# Patient Record
Sex: Female | Born: 1940 | Race: White | Hispanic: No | Marital: Married | Smoking: Former smoker
Health system: Southern US, Community
[De-identification: ages and names within clinical notes are randomized; demographics above are authoritative.]

## PROBLEM LIST (undated history)

## (undated) DIAGNOSIS — I639 Cerebral infarction, unspecified: Secondary | ICD-10-CM

## (undated) DIAGNOSIS — K219 Gastro-esophageal reflux disease without esophagitis: Secondary | ICD-10-CM

## (undated) DIAGNOSIS — E7800 Pure hypercholesterolemia, unspecified: Secondary | ICD-10-CM

## (undated) DIAGNOSIS — I10 Essential (primary) hypertension: Secondary | ICD-10-CM

## (undated) DIAGNOSIS — I2510 Atherosclerotic heart disease of native coronary artery without angina pectoris: Secondary | ICD-10-CM

## (undated) DIAGNOSIS — R112 Nausea with vomiting, unspecified: Secondary | ICD-10-CM

## (undated) DIAGNOSIS — Z8719 Personal history of other diseases of the digestive system: Secondary | ICD-10-CM

## (undated) DIAGNOSIS — Z9889 Other specified postprocedural states: Secondary | ICD-10-CM

## (undated) DIAGNOSIS — E039 Hypothyroidism, unspecified: Secondary | ICD-10-CM

## (undated) DIAGNOSIS — M1990 Unspecified osteoarthritis, unspecified site: Secondary | ICD-10-CM

## (undated) HISTORY — PX: CATARACT EXTRACTION, BILATERAL: SHX1313

## (undated) HISTORY — PX: NASAL SEPTUM SURGERY: SHX37

## (undated) HISTORY — PX: BACK SURGERY: SHX140

## (undated) HISTORY — PX: DILATION AND CURETTAGE OF UTERUS: SHX78

## (undated) HISTORY — PX: JOINT REPLACEMENT: SHX530

---
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
## 1962-11-17 HISTORY — PX: APPENDECTOMY: SHX54

## 1962-11-17 HISTORY — PX: CHOLECYSTECTOMY OPEN: SUR202

## 1984-11-17 HISTORY — PX: VAGINAL HYSTERECTOMY: SUR661

## 2009-11-17 HISTORY — PX: LUMBAR DISC SURGERY: SHX700

## 2009-11-17 HISTORY — PX: BLADDER SUSPENSION: SHX72

## 2009-11-17 HISTORY — PX: RECTOCELE REPAIR: SHX761

## 2010-06-17 HISTORY — PX: CORONARY ANGIOPLASTY WITH STENT PLACEMENT: SHX49

## 2013-05-06 ENCOUNTER — Emergency Department (HOSPITAL_BASED_OUTPATIENT_CLINIC_OR_DEPARTMENT_OTHER): Payer: Medicare Other

## 2013-05-06 ENCOUNTER — Emergency Department (HOSPITAL_BASED_OUTPATIENT_CLINIC_OR_DEPARTMENT_OTHER)
Admission: EM | Admit: 2013-05-06 | Discharge: 2013-05-06 | Disposition: A | Discharge status: Home / Self Care | Payer: Medicare Other | Attending: Emergency Medicine | Admitting: Emergency Medicine

## 2013-05-06 ENCOUNTER — Encounter (HOSPITAL_BASED_OUTPATIENT_CLINIC_OR_DEPARTMENT_OTHER): Payer: Self-pay | Admitting: *Deleted

## 2013-05-06 DIAGNOSIS — M25519 Pain in unspecified shoulder: Secondary | ICD-10-CM | POA: Insufficient documentation

## 2013-05-06 DIAGNOSIS — M719 Bursopathy, unspecified: Secondary | ICD-10-CM | POA: Insufficient documentation

## 2013-05-06 DIAGNOSIS — E7800 Pure hypercholesterolemia, unspecified: Secondary | ICD-10-CM | POA: Insufficient documentation

## 2013-05-06 DIAGNOSIS — Z7982 Long term (current) use of aspirin: Secondary | ICD-10-CM | POA: Insufficient documentation

## 2013-05-06 DIAGNOSIS — M67919 Unspecified disorder of synovium and tendon, unspecified shoulder: Secondary | ICD-10-CM | POA: Insufficient documentation

## 2013-05-06 DIAGNOSIS — Z79899 Other long term (current) drug therapy: Secondary | ICD-10-CM | POA: Insufficient documentation

## 2013-05-06 DIAGNOSIS — I10 Essential (primary) hypertension: Secondary | ICD-10-CM | POA: Insufficient documentation

## 2013-05-06 HISTORY — DX: Atherosclerotic heart disease of native coronary artery without angina pectoris: I25.10

## 2013-05-06 HISTORY — DX: Essential (primary) hypertension: I10

## 2013-05-06 HISTORY — DX: Gastro-esophageal reflux disease without esophagitis: K21.9

## 2013-05-06 HISTORY — DX: Pure hypercholesterolemia, unspecified: E78.00

## 2013-05-06 MED ORDER — HYDROCODONE-ACETAMINOPHEN 5-325 MG PO TABS: 2.0000 | ORAL_TABLET | ORAL | Status: DC | PRN

## 2013-05-06 NOTE — ED Notes (Addendum)
Right arm pain x 1.5 weeks. Pain when she raises her arm or tries to lift. Has been taking Advil for a week with temporary relief. Hx of bursitis in her left arm.

## 2013-05-06 NOTE — ED Notes (Signed)
Pt refused arm sling, reported that she already had one at home.

## 2013-05-06 NOTE — ED Provider Notes (Signed)
History     CSN: [id number]  Arrival date & time 05/06/13  1523   None     Chief Complaint  Patient presents with  . Arm Pain    (Consider location/radiation/quality/duration/timing/severity/associated sxs/prior treatment) Patient is a 72 y.o. female presenting with shoulder pain. The history is provided by the patient. No language interpreter was used.  Shoulder Pain This is a new problem. Episode onset: 2 weks. The problem occurs constantly. The problem has been unchanged. Associated symptoms include arthralgias. Nothing aggravates the symptoms. She has tried nothing for the symptoms.  Pt complains of pain and decreased use of right shoulder for 2 weeks  Past Medical History  Diagnosis Date  . Hypertension   . High cholesterol     No past surgical history on file.  No family history on file.  History  Substance Use Topics  . Smoking status: Not on file  . Smokeless tobacco: Not on file  . Alcohol Use: Not on file    OB History   Grav Para Term Preterm Abortions TAB SAB Ect Mult Living                  Review of Systems  Musculoskeletal: Positive for arthralgias.  All other systems reviewed and are negative.    Allergies  Review of patient's allergies indicates not on file.  Home Medications   Current Outpatient Rx  Name  Route  Sig  Dispense  Refill  . aspirin 81 MG tablet   Oral   Take 81 mg by mouth daily.         Marland Kitchen losartan (COZAAR) 50 MG tablet   Oral   Take 50 mg by mouth daily.         . metoprolol tartrate (LOPRESSOR) 25 MG tablet   Oral   Take 25 mg by mouth 2 (two) times daily.         Marland Kitchen omeprazole (PRILOSEC) 20 MG capsule   Oral   Take 20 mg by mouth daily.         . rosuvastatin (CRESTOR) 5 MG tablet   Oral   Take 5 mg by mouth daily.           BP 142/51  Pulse 70  Temp(Src) 98 F (36.7 C) (Oral)  Resp 20  Ht 5\' 4"  (1.626 m)  Wt 170 lb (77.111 kg)  BMI 29.17 kg/m2  SpO2 94%  Physical Exam  Nursing note and  vitals reviewed. Constitutional: She is oriented to person, place, and time. She appears well-developed and well-nourished.  HENT:  Head: Normocephalic.  Eyes: Conjunctivae and EOM are normal. Pupils are equal, round, and reactive to light.  Neck: Normal range of motion.  Cardiovascular: Normal rate.   Musculoskeletal:  decreased range of motion right shoulder,  Limited abduction, tender ac joint and rotator cuff area  Neurological: She is alert and oriented to person, place, and time. She has normal reflexes.  Skin: Skin is warm.  Psychiatric: She has a normal mood and affect.    ED Course  Procedures (including critical care time)  Labs Reviewed - No data to display No results found.   1. Tendonitis of shoulder, right       MDM  Xray shows ac joint degenerative changes.   I suspect pt has tendonitis.   Pt advised to follow up with Dr. Lajoyce Corners.   Rx for hydrocodone        Elson Areas, [place] 05/06/13 2032

## 2013-05-07 NOTE — ED Provider Notes (Signed)
Medical screening examination/treatment/procedure(s) were performed by non-physician practitioner and as supervising physician I was immediately available for consultation/collaboration.   July Linam, MD 05/07/13 1503 

## 2015-07-25 ENCOUNTER — Other Ambulatory Visit: Payer: Self-pay | Admitting: Neurological Surgery

## 2015-07-25 DIAGNOSIS — M4316 Spondylolisthesis, lumbar region: Secondary | ICD-10-CM

## 2015-07-31 ENCOUNTER — Ambulatory Visit
Admission: RE | Admit: 2015-07-31 | Discharge: 2015-07-31 | Disposition: A | Discharge status: Home / Self Care | Payer: PPO | Source: Ambulatory Visit | Attending: Neurological Surgery | Admitting: Neurological Surgery

## 2015-07-31 DIAGNOSIS — M4316 Spondylolisthesis, lumbar region: Secondary | ICD-10-CM

## 2015-07-31 MED ORDER — GADOBENATE DIMEGLUMINE 529 MG/ML IV SOLN
15.0000 mL | Freq: Once | INTRAVENOUS | Status: AC | PRN
  Administered 2015-07-31: 15 mL via INTRAVENOUS

## 2015-08-08 ENCOUNTER — Other Ambulatory Visit: Payer: Self-pay

## 2015-08-08 ENCOUNTER — Other Ambulatory Visit: Payer: Self-pay | Admitting: Neurological Surgery

## 2015-08-08 DIAGNOSIS — M4316 Spondylolisthesis, lumbar region: Secondary | ICD-10-CM

## 2015-08-09 ENCOUNTER — Ambulatory Visit
Admission: RE | Admit: 2015-08-09 | Discharge: 2015-08-09 | Disposition: A | Discharge status: Home / Self Care | Payer: PPO | Source: Ambulatory Visit | Attending: Neurological Surgery | Admitting: Neurological Surgery

## 2015-08-09 DIAGNOSIS — M4316 Spondylolisthesis, lumbar region: Secondary | ICD-10-CM

## 2015-11-18 DIAGNOSIS — Z87891 Personal history of nicotine dependence: Secondary | ICD-10-CM | POA: Diagnosis not present

## 2015-11-18 DIAGNOSIS — M549 Dorsalgia, unspecified: Secondary | ICD-10-CM | POA: Diagnosis not present

## 2015-11-18 DIAGNOSIS — G8929 Other chronic pain: Secondary | ICD-10-CM | POA: Diagnosis not present

## 2015-11-18 DIAGNOSIS — I2510 Atherosclerotic heart disease of native coronary artery without angina pectoris: Secondary | ICD-10-CM | POA: Diagnosis not present

## 2015-11-18 DIAGNOSIS — J449 Chronic obstructive pulmonary disease, unspecified: Secondary | ICD-10-CM | POA: Diagnosis not present

## 2015-11-18 DIAGNOSIS — L509 Urticaria, unspecified: Secondary | ICD-10-CM | POA: Diagnosis not present

## 2015-11-27 DIAGNOSIS — N390 Urinary tract infection, site not specified: Secondary | ICD-10-CM | POA: Diagnosis not present

## 2015-11-27 DIAGNOSIS — N3941 Urge incontinence: Secondary | ICD-10-CM | POA: Diagnosis not present

## 2015-11-27 DIAGNOSIS — R319 Hematuria, unspecified: Secondary | ICD-10-CM | POA: Diagnosis not present

## 2015-11-27 DIAGNOSIS — N281 Cyst of kidney, acquired: Secondary | ICD-10-CM | POA: Diagnosis not present

## 2015-12-07 DIAGNOSIS — Z1231 Encounter for screening mammogram for malignant neoplasm of breast: Secondary | ICD-10-CM | POA: Diagnosis not present

## 2015-12-11 DIAGNOSIS — M7542 Impingement syndrome of left shoulder: Secondary | ICD-10-CM | POA: Diagnosis not present

## 2015-12-19 DIAGNOSIS — I10 Essential (primary) hypertension: Secondary | ICD-10-CM | POA: Diagnosis not present

## 2015-12-19 DIAGNOSIS — E785 Hyperlipidemia, unspecified: Secondary | ICD-10-CM | POA: Diagnosis not present

## 2015-12-19 DIAGNOSIS — E039 Hypothyroidism, unspecified: Secondary | ICD-10-CM | POA: Diagnosis not present

## 2015-12-19 DIAGNOSIS — I2510 Atherosclerotic heart disease of native coronary artery without angina pectoris: Secondary | ICD-10-CM | POA: Diagnosis not present

## 2015-12-24 DIAGNOSIS — H43813 Vitreous degeneration, bilateral: Secondary | ICD-10-CM | POA: Diagnosis not present

## 2015-12-24 DIAGNOSIS — H25813 Combined forms of age-related cataract, bilateral: Secondary | ICD-10-CM | POA: Diagnosis not present

## 2015-12-24 DIAGNOSIS — H35033 Hypertensive retinopathy, bilateral: Secondary | ICD-10-CM | POA: Diagnosis not present

## 2015-12-24 DIAGNOSIS — H524 Presbyopia: Secondary | ICD-10-CM | POA: Diagnosis not present

## 2015-12-24 DIAGNOSIS — H04123 Dry eye syndrome of bilateral lacrimal glands: Secondary | ICD-10-CM | POA: Diagnosis not present

## 2015-12-30 DIAGNOSIS — J302 Other seasonal allergic rhinitis: Secondary | ICD-10-CM | POA: Diagnosis not present

## 2015-12-30 DIAGNOSIS — J020 Streptococcal pharyngitis: Secondary | ICD-10-CM | POA: Diagnosis not present

## 2015-12-30 DIAGNOSIS — J101 Influenza due to other identified influenza virus with other respiratory manifestations: Secondary | ICD-10-CM | POA: Diagnosis not present

## 2016-01-31 DIAGNOSIS — E785 Hyperlipidemia, unspecified: Secondary | ICD-10-CM | POA: Diagnosis not present

## 2016-02-04 DIAGNOSIS — E7800 Pure hypercholesterolemia, unspecified: Secondary | ICD-10-CM | POA: Diagnosis not present

## 2016-02-04 DIAGNOSIS — I10 Essential (primary) hypertension: Secondary | ICD-10-CM | POA: Diagnosis not present

## 2016-02-20 DIAGNOSIS — N281 Cyst of kidney, acquired: Secondary | ICD-10-CM | POA: Diagnosis not present

## 2016-02-20 DIAGNOSIS — N390 Urinary tract infection, site not specified: Secondary | ICD-10-CM | POA: Diagnosis not present

## 2016-02-20 DIAGNOSIS — N3941 Urge incontinence: Secondary | ICD-10-CM | POA: Diagnosis not present

## 2016-02-26 DIAGNOSIS — H524 Presbyopia: Secondary | ICD-10-CM | POA: Diagnosis not present

## 2016-02-26 DIAGNOSIS — H25813 Combined forms of age-related cataract, bilateral: Secondary | ICD-10-CM | POA: Diagnosis not present

## 2016-02-26 DIAGNOSIS — H35033 Hypertensive retinopathy, bilateral: Secondary | ICD-10-CM | POA: Diagnosis not present

## 2016-02-26 DIAGNOSIS — H04123 Dry eye syndrome of bilateral lacrimal glands: Secondary | ICD-10-CM | POA: Diagnosis not present

## 2016-02-26 DIAGNOSIS — H43813 Vitreous degeneration, bilateral: Secondary | ICD-10-CM | POA: Diagnosis not present

## 2016-02-28 DIAGNOSIS — M25562 Pain in left knee: Secondary | ICD-10-CM | POA: Diagnosis not present

## 2016-02-28 DIAGNOSIS — N390 Urinary tract infection, site not specified: Secondary | ICD-10-CM | POA: Diagnosis not present

## 2016-02-28 DIAGNOSIS — S8002XA Contusion of left knee, initial encounter: Secondary | ICD-10-CM | POA: Diagnosis not present

## 2016-02-28 DIAGNOSIS — N281 Cyst of kidney, acquired: Secondary | ICD-10-CM | POA: Diagnosis not present

## 2016-02-28 DIAGNOSIS — N3941 Urge incontinence: Secondary | ICD-10-CM | POA: Diagnosis not present

## 2016-02-28 DIAGNOSIS — K5909 Other constipation: Secondary | ICD-10-CM | POA: Diagnosis not present

## 2016-02-28 DIAGNOSIS — R310 Gross hematuria: Secondary | ICD-10-CM | POA: Diagnosis not present

## 2016-02-28 DIAGNOSIS — M129 Arthropathy, unspecified: Secondary | ICD-10-CM | POA: Diagnosis not present

## 2016-02-28 DIAGNOSIS — B373 Candidiasis of vulva and vagina: Secondary | ICD-10-CM | POA: Diagnosis not present

## 2016-03-12 DIAGNOSIS — H2511 Age-related nuclear cataract, right eye: Secondary | ICD-10-CM | POA: Diagnosis not present

## 2016-03-12 DIAGNOSIS — H2512 Age-related nuclear cataract, left eye: Secondary | ICD-10-CM | POA: Diagnosis not present

## 2016-03-18 DIAGNOSIS — N3000 Acute cystitis without hematuria: Secondary | ICD-10-CM | POA: Diagnosis not present

## 2016-03-18 DIAGNOSIS — N3941 Urge incontinence: Secondary | ICD-10-CM | POA: Diagnosis not present

## 2016-03-18 DIAGNOSIS — N281 Cyst of kidney, acquired: Secondary | ICD-10-CM | POA: Diagnosis not present

## 2016-03-19 DIAGNOSIS — C44321 Squamous cell carcinoma of skin of nose: Secondary | ICD-10-CM | POA: Diagnosis not present

## 2016-03-19 DIAGNOSIS — L570 Actinic keratosis: Secondary | ICD-10-CM | POA: Diagnosis not present

## 2016-03-19 DIAGNOSIS — Z08 Encounter for follow-up examination after completed treatment for malignant neoplasm: Secondary | ICD-10-CM | POA: Diagnosis not present

## 2016-03-19 DIAGNOSIS — L820 Inflamed seborrheic keratosis: Secondary | ICD-10-CM | POA: Diagnosis not present

## 2016-03-19 DIAGNOSIS — Z85828 Personal history of other malignant neoplasm of skin: Secondary | ICD-10-CM | POA: Diagnosis not present

## 2016-03-19 DIAGNOSIS — D485 Neoplasm of uncertain behavior of skin: Secondary | ICD-10-CM | POA: Diagnosis not present

## 2016-03-31 DIAGNOSIS — H2511 Age-related nuclear cataract, right eye: Secondary | ICD-10-CM | POA: Diagnosis not present

## 2016-03-31 DIAGNOSIS — H25811 Combined forms of age-related cataract, right eye: Secondary | ICD-10-CM | POA: Diagnosis not present

## 2016-04-15 DIAGNOSIS — R310 Gross hematuria: Secondary | ICD-10-CM | POA: Diagnosis not present

## 2016-04-15 DIAGNOSIS — K5909 Other constipation: Secondary | ICD-10-CM | POA: Diagnosis not present

## 2016-04-15 DIAGNOSIS — N3941 Urge incontinence: Secondary | ICD-10-CM | POA: Diagnosis not present

## 2016-04-15 DIAGNOSIS — N3000 Acute cystitis without hematuria: Secondary | ICD-10-CM | POA: Diagnosis not present

## 2016-04-15 DIAGNOSIS — N281 Cyst of kidney, acquired: Secondary | ICD-10-CM | POA: Diagnosis not present

## 2016-04-17 DIAGNOSIS — C44321 Squamous cell carcinoma of skin of nose: Secondary | ICD-10-CM | POA: Diagnosis not present

## 2016-04-21 DIAGNOSIS — H2511 Age-related nuclear cataract, right eye: Secondary | ICD-10-CM | POA: Diagnosis not present

## 2016-04-21 DIAGNOSIS — H25812 Combined forms of age-related cataract, left eye: Secondary | ICD-10-CM | POA: Diagnosis not present

## 2016-04-21 DIAGNOSIS — H2512 Age-related nuclear cataract, left eye: Secondary | ICD-10-CM | POA: Diagnosis not present

## 2016-05-13 DIAGNOSIS — M1712 Unilateral primary osteoarthritis, left knee: Secondary | ICD-10-CM | POA: Diagnosis not present

## 2016-06-12 DIAGNOSIS — E7800 Pure hypercholesterolemia, unspecified: Secondary | ICD-10-CM | POA: Diagnosis not present

## 2016-06-16 DIAGNOSIS — I10 Essential (primary) hypertension: Secondary | ICD-10-CM | POA: Diagnosis not present

## 2016-06-16 DIAGNOSIS — L659 Nonscarring hair loss, unspecified: Secondary | ICD-10-CM | POA: Diagnosis not present

## 2016-06-16 DIAGNOSIS — E7800 Pure hypercholesterolemia, unspecified: Secondary | ICD-10-CM | POA: Diagnosis not present

## 2016-06-16 DIAGNOSIS — E039 Hypothyroidism, unspecified: Secondary | ICD-10-CM | POA: Diagnosis not present

## 2016-06-27 DIAGNOSIS — M7542 Impingement syndrome of left shoulder: Secondary | ICD-10-CM | POA: Diagnosis not present

## 2016-06-27 DIAGNOSIS — M75102 Unspecified rotator cuff tear or rupture of left shoulder, not specified as traumatic: Secondary | ICD-10-CM | POA: Diagnosis not present

## 2016-06-27 DIAGNOSIS — M1712 Unilateral primary osteoarthritis, left knee: Secondary | ICD-10-CM | POA: Diagnosis not present

## 2016-07-09 DIAGNOSIS — Z1273 Encounter for screening for malignant neoplasm of ovary: Secondary | ICD-10-CM | POA: Diagnosis not present

## 2016-07-09 DIAGNOSIS — N898 Other specified noninflammatory disorders of vagina: Secondary | ICD-10-CM | POA: Diagnosis not present

## 2016-07-09 DIAGNOSIS — Z01419 Encounter for gynecological examination (general) (routine) without abnormal findings: Secondary | ICD-10-CM | POA: Diagnosis not present

## 2016-07-09 DIAGNOSIS — R350 Frequency of micturition: Secondary | ICD-10-CM | POA: Diagnosis not present

## 2016-07-09 DIAGNOSIS — R1031 Right lower quadrant pain: Secondary | ICD-10-CM | POA: Diagnosis not present

## 2016-07-16 DIAGNOSIS — M7582 Other shoulder lesions, left shoulder: Secondary | ICD-10-CM | POA: Diagnosis not present

## 2016-07-16 DIAGNOSIS — M24012 Loose body in left shoulder: Secondary | ICD-10-CM | POA: Diagnosis not present

## 2016-07-16 DIAGNOSIS — M19012 Primary osteoarthritis, left shoulder: Secondary | ICD-10-CM | POA: Diagnosis not present

## 2016-07-16 DIAGNOSIS — M25412 Effusion, left shoulder: Secondary | ICD-10-CM | POA: Diagnosis not present

## 2016-07-22 DIAGNOSIS — M25812 Other specified joint disorders, left shoulder: Secondary | ICD-10-CM | POA: Diagnosis not present

## 2016-07-22 DIAGNOSIS — M7542 Impingement syndrome of left shoulder: Secondary | ICD-10-CM | POA: Diagnosis not present

## 2016-07-22 DIAGNOSIS — M19012 Primary osteoarthritis, left shoulder: Secondary | ICD-10-CM | POA: Diagnosis not present

## 2016-07-22 DIAGNOSIS — M1712 Unilateral primary osteoarthritis, left knee: Secondary | ICD-10-CM | POA: Diagnosis not present

## 2016-08-11 DIAGNOSIS — M47816 Spondylosis without myelopathy or radiculopathy, lumbar region: Secondary | ICD-10-CM | POA: Diagnosis not present

## 2016-08-11 DIAGNOSIS — M961 Postlaminectomy syndrome, not elsewhere classified: Secondary | ICD-10-CM | POA: Diagnosis not present

## 2016-08-11 DIAGNOSIS — I10 Essential (primary) hypertension: Secondary | ICD-10-CM | POA: Diagnosis not present

## 2016-08-17 DIAGNOSIS — I639 Cerebral infarction, unspecified: Secondary | ICD-10-CM

## 2016-08-17 HISTORY — DX: Cerebral infarction, unspecified: I63.9

## 2016-08-21 DIAGNOSIS — M1712 Unilateral primary osteoarthritis, left knee: Secondary | ICD-10-CM | POA: Diagnosis not present

## 2016-08-27 DIAGNOSIS — M47816 Spondylosis without myelopathy or radiculopathy, lumbar region: Secondary | ICD-10-CM | POA: Diagnosis not present

## 2016-08-28 DIAGNOSIS — M1712 Unilateral primary osteoarthritis, left knee: Secondary | ICD-10-CM | POA: Diagnosis not present

## 2016-09-05 DIAGNOSIS — M1712 Unilateral primary osteoarthritis, left knee: Secondary | ICD-10-CM | POA: Diagnosis not present

## 2016-09-07 DIAGNOSIS — I6529 Occlusion and stenosis of unspecified carotid artery: Secondary | ICD-10-CM | POA: Diagnosis not present

## 2016-09-07 DIAGNOSIS — I2722 Pulmonary hypertension due to left heart disease: Secondary | ICD-10-CM | POA: Diagnosis not present

## 2016-09-07 DIAGNOSIS — R4182 Altered mental status, unspecified: Secondary | ICD-10-CM | POA: Diagnosis not present

## 2016-09-07 DIAGNOSIS — I6523 Occlusion and stenosis of bilateral carotid arteries: Secondary | ICD-10-CM | POA: Diagnosis not present

## 2016-09-07 DIAGNOSIS — G8929 Other chronic pain: Secondary | ICD-10-CM | POA: Diagnosis not present

## 2016-09-07 DIAGNOSIS — R4781 Slurred speech: Secondary | ICD-10-CM | POA: Diagnosis not present

## 2016-09-07 DIAGNOSIS — Z7982 Long term (current) use of aspirin: Secondary | ICD-10-CM | POA: Diagnosis not present

## 2016-09-07 DIAGNOSIS — M1990 Unspecified osteoarthritis, unspecified site: Secondary | ICD-10-CM | POA: Diagnosis not present

## 2016-09-07 DIAGNOSIS — E039 Hypothyroidism, unspecified: Secondary | ICD-10-CM | POA: Diagnosis not present

## 2016-09-07 DIAGNOSIS — G451 Carotid artery syndrome (hemispheric): Secondary | ICD-10-CM | POA: Diagnosis not present

## 2016-09-07 DIAGNOSIS — E873 Alkalosis: Secondary | ICD-10-CM | POA: Diagnosis not present

## 2016-09-07 DIAGNOSIS — Z8673 Personal history of transient ischemic attack (TIA), and cerebral infarction without residual deficits: Secondary | ICD-10-CM | POA: Diagnosis not present

## 2016-09-07 DIAGNOSIS — E876 Hypokalemia: Secondary | ICD-10-CM | POA: Diagnosis not present

## 2016-09-07 DIAGNOSIS — E7800 Pure hypercholesterolemia, unspecified: Secondary | ICD-10-CM | POA: Diagnosis not present

## 2016-09-07 DIAGNOSIS — R410 Disorientation, unspecified: Secondary | ICD-10-CM | POA: Diagnosis not present

## 2016-09-07 DIAGNOSIS — Z23 Encounter for immunization: Secondary | ICD-10-CM | POA: Diagnosis not present

## 2016-09-07 DIAGNOSIS — M549 Dorsalgia, unspecified: Secondary | ICD-10-CM | POA: Diagnosis not present

## 2016-09-07 DIAGNOSIS — R531 Weakness: Secondary | ICD-10-CM | POA: Diagnosis not present

## 2016-09-07 DIAGNOSIS — J449 Chronic obstructive pulmonary disease, unspecified: Secondary | ICD-10-CM | POA: Diagnosis not present

## 2016-09-07 DIAGNOSIS — I6522 Occlusion and stenosis of left carotid artery: Secondary | ICD-10-CM | POA: Diagnosis not present

## 2016-09-07 DIAGNOSIS — I081 Rheumatic disorders of both mitral and tricuspid valves: Secondary | ICD-10-CM | POA: Diagnosis not present

## 2016-09-07 DIAGNOSIS — E785 Hyperlipidemia, unspecified: Secondary | ICD-10-CM | POA: Diagnosis not present

## 2016-09-07 DIAGNOSIS — Z880 Allergy status to penicillin: Secondary | ICD-10-CM | POA: Diagnosis not present

## 2016-09-07 DIAGNOSIS — Z885 Allergy status to narcotic agent status: Secondary | ICD-10-CM | POA: Diagnosis not present

## 2016-09-07 DIAGNOSIS — I10 Essential (primary) hypertension: Secondary | ICD-10-CM | POA: Diagnosis not present

## 2016-09-07 DIAGNOSIS — I2510 Atherosclerotic heart disease of native coronary artery without angina pectoris: Secondary | ICD-10-CM | POA: Diagnosis not present

## 2016-09-07 DIAGNOSIS — K219 Gastro-esophageal reflux disease without esophagitis: Secondary | ICD-10-CM | POA: Diagnosis not present

## 2016-09-07 DIAGNOSIS — R262 Difficulty in walking, not elsewhere classified: Secondary | ICD-10-CM | POA: Diagnosis not present

## 2016-09-08 DIAGNOSIS — G451 Carotid artery syndrome (hemispheric): Secondary | ICD-10-CM | POA: Diagnosis not present

## 2016-09-08 DIAGNOSIS — I361 Nonrheumatic tricuspid (valve) insufficiency: Secondary | ICD-10-CM | POA: Diagnosis not present

## 2016-09-08 DIAGNOSIS — I6522 Occlusion and stenosis of left carotid artery: Secondary | ICD-10-CM | POA: Diagnosis not present

## 2016-09-08 DIAGNOSIS — I2729 Other secondary pulmonary hypertension: Secondary | ICD-10-CM | POA: Diagnosis not present

## 2016-09-08 DIAGNOSIS — E876 Hypokalemia: Secondary | ICD-10-CM | POA: Diagnosis not present

## 2016-09-08 DIAGNOSIS — I10 Essential (primary) hypertension: Secondary | ICD-10-CM | POA: Diagnosis not present

## 2016-09-08 DIAGNOSIS — E039 Hypothyroidism, unspecified: Secondary | ICD-10-CM | POA: Diagnosis not present

## 2016-09-09 DIAGNOSIS — I340 Nonrheumatic mitral (valve) insufficiency: Secondary | ICD-10-CM | POA: Diagnosis not present

## 2016-09-09 DIAGNOSIS — I6522 Occlusion and stenosis of left carotid artery: Secondary | ICD-10-CM | POA: Diagnosis not present

## 2016-09-09 DIAGNOSIS — E876 Hypokalemia: Secondary | ICD-10-CM | POA: Diagnosis not present

## 2016-09-09 DIAGNOSIS — I2510 Atherosclerotic heart disease of native coronary artery without angina pectoris: Secondary | ICD-10-CM | POA: Diagnosis not present

## 2016-09-09 DIAGNOSIS — I10 Essential (primary) hypertension: Secondary | ICD-10-CM | POA: Diagnosis not present

## 2016-09-09 DIAGNOSIS — G451 Carotid artery syndrome (hemispheric): Secondary | ICD-10-CM | POA: Diagnosis not present

## 2016-09-17 DIAGNOSIS — G451 Carotid artery syndrome (hemispheric): Secondary | ICD-10-CM | POA: Diagnosis not present

## 2016-09-17 DIAGNOSIS — Z7982 Long term (current) use of aspirin: Secondary | ICD-10-CM | POA: Diagnosis not present

## 2016-09-22 DIAGNOSIS — I70203 Unspecified atherosclerosis of native arteries of extremities, bilateral legs: Secondary | ICD-10-CM | POA: Diagnosis not present

## 2016-09-22 DIAGNOSIS — Z08 Encounter for follow-up examination after completed treatment for malignant neoplasm: Secondary | ICD-10-CM | POA: Diagnosis not present

## 2016-09-22 DIAGNOSIS — Z85828 Personal history of other malignant neoplasm of skin: Secondary | ICD-10-CM | POA: Diagnosis not present

## 2016-09-22 DIAGNOSIS — L820 Inflamed seborrheic keratosis: Secondary | ICD-10-CM | POA: Diagnosis not present

## 2016-09-22 DIAGNOSIS — E7800 Pure hypercholesterolemia, unspecified: Secondary | ICD-10-CM | POA: Diagnosis not present

## 2016-09-22 DIAGNOSIS — Z87891 Personal history of nicotine dependence: Secondary | ICD-10-CM | POA: Diagnosis not present

## 2016-09-22 DIAGNOSIS — L570 Actinic keratosis: Secondary | ICD-10-CM | POA: Diagnosis not present

## 2016-09-22 DIAGNOSIS — I10 Essential (primary) hypertension: Secondary | ICD-10-CM | POA: Diagnosis not present

## 2016-09-22 DIAGNOSIS — I6523 Occlusion and stenosis of bilateral carotid arteries: Secondary | ICD-10-CM | POA: Diagnosis not present

## 2016-11-03 DIAGNOSIS — E039 Hypothyroidism, unspecified: Secondary | ICD-10-CM | POA: Diagnosis not present

## 2016-11-03 DIAGNOSIS — D510 Vitamin B12 deficiency anemia due to intrinsic factor deficiency: Secondary | ICD-10-CM | POA: Diagnosis not present

## 2016-11-03 DIAGNOSIS — J302 Other seasonal allergic rhinitis: Secondary | ICD-10-CM | POA: Diagnosis not present

## 2016-11-03 DIAGNOSIS — Z79899 Other long term (current) drug therapy: Secondary | ICD-10-CM | POA: Diagnosis not present

## 2016-11-03 DIAGNOSIS — E7800 Pure hypercholesterolemia, unspecified: Secondary | ICD-10-CM | POA: Diagnosis not present

## 2016-11-03 DIAGNOSIS — Z23 Encounter for immunization: Secondary | ICD-10-CM | POA: Diagnosis not present

## 2016-11-03 DIAGNOSIS — Z0000 Encounter for general adult medical examination without abnormal findings: Secondary | ICD-10-CM | POA: Diagnosis not present

## 2016-11-03 DIAGNOSIS — I2510 Atherosclerotic heart disease of native coronary artery without angina pectoris: Secondary | ICD-10-CM | POA: Diagnosis not present

## 2016-11-03 DIAGNOSIS — R911 Solitary pulmonary nodule: Secondary | ICD-10-CM | POA: Diagnosis not present

## 2016-11-03 DIAGNOSIS — I10 Essential (primary) hypertension: Secondary | ICD-10-CM | POA: Diagnosis not present

## 2016-11-03 DIAGNOSIS — M810 Age-related osteoporosis without current pathological fracture: Secondary | ICD-10-CM | POA: Diagnosis not present

## 2016-11-18 DIAGNOSIS — R911 Solitary pulmonary nodule: Secondary | ICD-10-CM | POA: Diagnosis not present

## 2016-12-03 ENCOUNTER — Ambulatory Visit (INDEPENDENT_AMBULATORY_CARE_PROVIDER_SITE_OTHER): Payer: Self-pay | Admitting: Orthopaedic Surgery

## 2016-12-08 DIAGNOSIS — Z1231 Encounter for screening mammogram for malignant neoplasm of breast: Secondary | ICD-10-CM | POA: Diagnosis not present

## 2016-12-11 ENCOUNTER — Ambulatory Visit (INDEPENDENT_AMBULATORY_CARE_PROVIDER_SITE_OTHER): Payer: PPO | Admitting: Physician Assistant

## 2016-12-11 ENCOUNTER — Encounter (INDEPENDENT_AMBULATORY_CARE_PROVIDER_SITE_OTHER): Payer: Self-pay | Admitting: Physician Assistant

## 2016-12-11 ENCOUNTER — Ambulatory Visit (INDEPENDENT_AMBULATORY_CARE_PROVIDER_SITE_OTHER): Payer: PPO

## 2016-12-11 DIAGNOSIS — M25562 Pain in left knee: Secondary | ICD-10-CM

## 2016-12-11 DIAGNOSIS — M25512 Pain in left shoulder: Secondary | ICD-10-CM

## 2016-12-11 DIAGNOSIS — M19012 Primary osteoarthritis, left shoulder: Secondary | ICD-10-CM

## 2016-12-11 NOTE — Progress Notes (Signed)
Office Visit Note   Patient: Sandra Abbott           Date of Birth: 09/12/1941           MRN: [id number] Visit Date: 12/11/2016              Requested by: No referring provider defined for this encounter. PCP: Derrill Center, MD   Assessment & Plan: Visit Diagnoses:  1. Acute pain of left shoulder   2. Acute pain of left knee   3. Primary osteoarthritis of left shoulder     Plan: We discussed the patient her findings on x-rays and clinical exam today due to the fact that she's had multiple injections in her left knee despite this continues to have pain. This time would recommend MRI of the left knee to rule out medial meniscal tear also evaluate the cartilage. Follow-up 2 weeks after the MRI to go over the results and discuss further treatment. In regards to the shoulder she's had appropriate treatment for her left shoulder glenohumeral arthritis Dr. Ninfa Linden and myself feel that the next step would be to refer her to a surgeon for left shoulder replacement. She did like to think about this and will let us know if she wants a referral. Did encourage her to try to keep the shoulder moving as much as possible doing pendulum,Codman and wall crawls.  Follow-Up Instructions: Return in about 2 weeks (around 12/25/2016) for After MRI.   Orders:  Orders Placed This Encounter  Procedures  . XR Knee 1-2 Views Left  . XR Shoulder Left  . MR Knee Left w/o contrast   No orders of the defined types were placed in this encounter.     Procedures: No procedures performed   Clinical Data: No additional findings.   Subjective: Chief Complaint  Patient presents with  . Left Knee - Pain  . Left Shoulder - Pain    HPI Sandra Abbott is a 76 year old female who comes in today with chief complaint of left knee and left shoulder pain. She denies any injury to the left knee. She notes that the left knee hurts with walking or bending she states she has decreased range of motion of the knee. And  that she has swelling of the knee. She reports multiple cortisone injections in the left knee without any significant relief of her pain. Also reports Synvisc  supplemental injections left knee with no relief. Her left knee pain has been ongoing for the past year  Left shoulder pain and decreased range of motion. He reports that on MRI showed she had arthritis of the shoulder and was told that she needed a shoulder replacement. She had one intra-articular injection of the left shoulder and gave her good relief for about 2 weeks and then pain returned. He denies any radicular symptoms down the left arm or neck pain. Review of Systems See HPI  Objective: Vital Signs: There were no vitals taken for this visit.  Physical Exam  Constitutional: She is oriented to person, place, and time. She appears well-developed and well-nourished. No distress.  Eyes: EOM are normal.  Cardiovascular: Intact distal pulses.   Pulmonary/Chest: Effort normal.  Neurological: She is alert and oriented to person, place, and time.  Psychiatric: She has a normal mood and affect.    Ortho Exam Bilateral shoulders 5 out of 5 strengths against resistance with internal and external rotation. Right shoulder full forward flexion actively. Left shoulder forward flexion actively to 110 passively I  can bring her to 130 she has significant pain with this maneuver. Empty can test negative bilaterally. Impingement testing positive left shoulder. Radial pulses are 2+ bilaterally and equal. Patient intact bilateral hands to light touch throughout. Bilateral knees she has full extension right knee full flexion left knee flexion to approximately 110. No instability with valgus varus stressing of either knee. Positive McMurray's left knee. Global tenderness left knee. No abnormal warmth erythema or ecchymosis of either knee. Left knee with mild effusion. Able to do a straight leg raise bilaterally. Specialty Comments:  No specialty  comments available.  Imaging: Xr Knee 1-2 Views Left  Result Date: 12/11/2016 Left knee 2 views: Mild tricompartmental changes of the left knee. No acute fractures no other bony abnormalities  Xr Shoulder Left  Result Date: 12/11/2016 3 views left shoulder: No acute fracture. Humerus is well located. General changes acromioclavicular joint. End-stage arthritic changes of the glenohumeral joint.    PMFS History: There are no active problems to display for this patient.  Past Medical History:  Diagnosis Date  . Coronary artery disease   . GERD (gastroesophageal reflux disease)   . High cholesterol   . High cholesterol   . Hypertension     No family history on file.  Past Surgical History:  Procedure Laterality Date  . ABDOMINAL HYSTERECTOMY    . BACK SURGERY    . BLADDER SURGERY    . CHOLECYSTECTOMY    . CORONARY ANGIOPLASTY WITH STENT PLACEMENT     Social History   Occupational History  . Not on file.   Social History Main Topics  . Smoking status: Former Smoker    Quit date: 05/06/1988  . Smokeless tobacco: Never Used  . Alcohol use No  . Drug use: No  . Sexual activity: Not on file

## 2016-12-18 DIAGNOSIS — H524 Presbyopia: Secondary | ICD-10-CM | POA: Diagnosis not present

## 2016-12-18 DIAGNOSIS — H04123 Dry eye syndrome of bilateral lacrimal glands: Secondary | ICD-10-CM | POA: Diagnosis not present

## 2016-12-18 DIAGNOSIS — H25813 Combined forms of age-related cataract, bilateral: Secondary | ICD-10-CM | POA: Diagnosis not present

## 2016-12-18 DIAGNOSIS — H43813 Vitreous degeneration, bilateral: Secondary | ICD-10-CM | POA: Diagnosis not present

## 2016-12-18 DIAGNOSIS — H35033 Hypertensive retinopathy, bilateral: Secondary | ICD-10-CM | POA: Diagnosis not present

## 2016-12-23 ENCOUNTER — Inpatient Hospital Stay: Admission: RE | Admit: 2016-12-23 | Payer: PPO | Source: Ambulatory Visit

## 2016-12-28 ENCOUNTER — Other Ambulatory Visit: Payer: PPO

## 2016-12-29 ENCOUNTER — Ambulatory Visit (INDEPENDENT_AMBULATORY_CARE_PROVIDER_SITE_OTHER): Payer: PPO | Admitting: Orthopaedic Surgery

## 2016-12-30 ENCOUNTER — Ambulatory Visit
Admission: RE | Admit: 2016-12-30 | Discharge: 2016-12-30 | Disposition: A | Discharge status: Home / Self Care | Payer: PPO | Source: Ambulatory Visit | Attending: Physician Assistant | Admitting: Physician Assistant

## 2016-12-30 DIAGNOSIS — M25562 Pain in left knee: Secondary | ICD-10-CM

## 2017-01-05 ENCOUNTER — Ambulatory Visit (INDEPENDENT_AMBULATORY_CARE_PROVIDER_SITE_OTHER): Payer: PPO | Admitting: Orthopaedic Surgery

## 2017-01-05 DIAGNOSIS — G8929 Other chronic pain: Secondary | ICD-10-CM

## 2017-01-05 DIAGNOSIS — M1712 Unilateral primary osteoarthritis, left knee: Secondary | ICD-10-CM | POA: Insufficient documentation

## 2017-01-05 DIAGNOSIS — M25562 Pain in left knee: Secondary | ICD-10-CM | POA: Diagnosis not present

## 2017-01-05 NOTE — Progress Notes (Signed)
Mrs. Omar is following up after MRI of her left knee. We decided obtain this MRI due to the severe pain she is having in her left knee with locking catching. Although she 76 years old her joint spaces on plain films still looked like there was decent space and her knee. She has tried multiple steroid injection in this knee as well as hyaluronic acid injections. She's worked on anti-inflammatories, activity modification, and quad training exercises. Her pain is daily and this point is 10 out of 10. It is detrimentally affected her activities daily living, her quality of life, and her mobility. She is here for review of MRI of her knee today. She said her pain is getting worse.  On examination of her left knee she has global tenderness. Hurts on both medial and lateral joint line. His patellofemoral crepitation as well. The knee feels ligamentously stable and there is painful range motion of the knee and the range of motion is full. MRI is reviewed with her and her husband and shows areas of high-grade partial to full-thickness cartilage loss all throughout the knee. It's most prevalent on the medial femoral condyle medial tibial plateau. There is degenerative tearing of the medial and lateral meniscus as well as thickening of the MCL. There significant particular osteophytes and patellofemoral disease.  Given the severity of findings on the MRI we are recommending knee replacement surgery. Also given the fact she is failed all forms conservative treatment. I showed her a knee model and explained in detail what knee replacement surgery involves. We had a thorough discussion of the risks and benefits of this surgery as well as a detailed discussion of her intraoperative and postoperative course. She does wish to have this surgery performed in the near future with her goals are decreased pain, improve mobility, and improved quality of life. We will work on getting the surgery set up. We would then see her back at  2 weeks postoperative for wound assessment and mobility assessment but no x-rays of be needed.

## 2017-01-06 ENCOUNTER — Other Ambulatory Visit (INDEPENDENT_AMBULATORY_CARE_PROVIDER_SITE_OTHER): Payer: Self-pay | Admitting: Physician Assistant

## 2017-01-07 DIAGNOSIS — I2510 Atherosclerotic heart disease of native coronary artery without angina pectoris: Secondary | ICD-10-CM | POA: Diagnosis not present

## 2017-01-07 DIAGNOSIS — I10 Essential (primary) hypertension: Secondary | ICD-10-CM | POA: Diagnosis not present

## 2017-01-07 DIAGNOSIS — E785 Hyperlipidemia, unspecified: Secondary | ICD-10-CM | POA: Diagnosis not present

## 2017-01-07 DIAGNOSIS — I6529 Occlusion and stenosis of unspecified carotid artery: Secondary | ICD-10-CM | POA: Diagnosis not present

## 2017-01-19 DIAGNOSIS — I2510 Atherosclerotic heart disease of native coronary artery without angina pectoris: Secondary | ICD-10-CM | POA: Diagnosis not present

## 2017-01-19 DIAGNOSIS — E785 Hyperlipidemia, unspecified: Secondary | ICD-10-CM | POA: Diagnosis not present

## 2017-02-03 ENCOUNTER — Encounter (HOSPITAL_COMMUNITY)
Admission: RE | Admit: 2017-02-03 | Discharge: 2017-02-03 | Disposition: A | Discharge status: Home / Self Care | Payer: PPO | Source: Ambulatory Visit | Attending: Orthopaedic Surgery | Admitting: Orthopaedic Surgery

## 2017-02-03 ENCOUNTER — Other Ambulatory Visit (INDEPENDENT_AMBULATORY_CARE_PROVIDER_SITE_OTHER): Payer: Self-pay | Admitting: Orthopaedic Surgery

## 2017-02-03 ENCOUNTER — Encounter (HOSPITAL_COMMUNITY): Payer: Self-pay

## 2017-02-03 DIAGNOSIS — Z01812 Encounter for preprocedural laboratory examination: Secondary | ICD-10-CM | POA: Diagnosis not present

## 2017-02-03 DIAGNOSIS — M1712 Unilateral primary osteoarthritis, left knee: Secondary | ICD-10-CM

## 2017-02-03 HISTORY — DX: Hypothyroidism, unspecified: E03.9

## 2017-02-03 HISTORY — DX: Other specified postprocedural states: Z98.890

## 2017-02-03 HISTORY — DX: Unspecified osteoarthritis, unspecified site: M19.90

## 2017-02-03 HISTORY — DX: Personal history of other diseases of the digestive system: Z87.19

## 2017-02-03 HISTORY — DX: Cerebral infarction, unspecified: I63.9

## 2017-02-03 HISTORY — DX: Nausea with vomiting, unspecified: R11.2

## 2017-02-03 LAB — BASIC METABOLIC PANEL
Anion gap: 11 (ref 5–15)
BUN: 12 mg/dL (ref 6–20)
CHLORIDE: 104 mmol/L (ref 101–111)
CO2: 24 mmol/L (ref 22–32)
CREATININE: 0.75 mg/dL (ref 0.44–1.00)
Calcium: 9.1 mg/dL (ref 8.9–10.3)
GFR calc Af Amer: >60 mL/min (ref >60)
GFR calc non Af Amer: >60 mL/min (ref >60)
Glucose, Bld: 117 mg/dL — ABNORMAL HIGH (ref 65–99)
Potassium: 3.7 mmol/L (ref 3.5–5.1)
SODIUM: 139 mmol/L (ref 135–145)

## 2017-02-03 LAB — CBC
HCT: 38 % (ref 36.0–46.0)
Hemoglobin: 12.6 g/dL (ref 12.0–15.0)
MCH: 30.4 pg (ref 26.0–34.0)
MCHC: 33.2 g/dL (ref 30.0–36.0)
MCV: 91.8 fL (ref 78.0–100.0)
Platelets: 225 10*3/uL (ref 150–400)
RBC: 4.14 MIL/uL (ref 3.87–5.11)
RDW: 12.9 % (ref 11.5–15.5)
WBC: 6 10*3/uL (ref 4.0–10.5)

## 2017-02-03 LAB — SURGICAL PCR SCREEN
MRSA, PCR: NEGATIVE
STAPHYLOCOCCUS AUREUS: NEGATIVE

## 2017-02-03 MED ORDER — CLINDAMYCIN PHOSPHATE 900 MG/50ML IV SOLN
900.0000 mg | Freq: Once | INTRAVENOUS | Status: AC
  Administered 2017-02-10: 900 mg via INTRAVENOUS

## 2017-02-03 NOTE — Progress Notes (Addendum)
PCP: Dr. Jenny Reichmann weaver in [facility] Cardiologist: Dr. Blanchie Serve in [facility]  Pt. Stopped plavix 02/02/17. Pt. Unsure  if Dr. Ninfa Linden wanted her to stop aspirin. Office closed for lunch. Instructed pt. To call his office and get clarification . Pt. Verbalizes understanding.  Left message for sherrie billings  @ Dr. Trevor Mace office of pt. Allergy to PCN.

## 2017-02-03 NOTE — Pre-Procedure Instructions (Addendum)
Sandra Abbott  02/03/2017      [place], [place] - [address] [place] [address] Phone: [phone] Fax: [phone]    Your procedure is scheduled on Tues. March 27  Report to [facility] Admitting at 11:00 A.M.  Call this number if you have problems the morning of surgery:  [phone]   Remember:  Do not eat food or drink liquids after midnight.  Take these medicines the morning of surgery with A SIP OF WATER : tylenol if needed, zyrtec, flonase, levothyroxine(synthroid), metoprolol (lopressor), omeprazole(prilosec)             Stop aspirin, advil, motrin, ibuprofen, aleve, BC Powders, Goody's, vitamins/herbal medicines: fish oil   Do not wear jewelry, make-up or nail polish.  Do not wear lotions, powders, or perfumes, or deoderant.  Do not shave 48 hours prior to surgery.  Men may shave face and neck.  Do not bring valuables to the hospital.  [facility] is not responsible for any belongings or valuables.  Contacts, dentures or bridgework may not be worn into surgery.  Leave your suitcase in the car.  After surgery it may be brought to your room.  For patients admitted to the hospital, discharge time will be determined by your treatment team.  Patients discharged the day of surgery will not be allowed to drive home.    Special instructions:  [facility]- Preparing For Surgery  Before surgery, you can play an important role. Because skin is not sterile, your skin needs to be as free of germs as possible. You can reduce the number of germs on your skin by washing with CHG (chlorahexidine gluconate) Soap before surgery.  CHG is an antiseptic cleaner which kills germs and bonds with the skin to continue killing germs even after washing.  Please do not use if you have an allergy to CHG or antibacterial soaps. If your skin becomes reddened/irritated stop using the CHG.  Do not shave (including legs and underarms)  for at least 48 hours prior to first CHG shower. It is OK to shave your face.  Please follow these instructions carefully.   1. Shower the NIGHT BEFORE SURGERY and the MORNING OF SURGERY with CHG.   2. If you chose to wash your hair, wash your hair first as usual with your normal shampoo.  3. After you shampoo, rinse your hair and body thoroughly to remove the shampoo.  4. Use CHG as you would any other liquid soap. You can apply CHG directly to the skin and wash gently with a scrungie or a clean washcloth.   5. Apply the CHG Soap to your body ONLY FROM THE NECK DOWN.  Do not use on open wounds or open sores. Avoid contact with your eyes, ears, mouth and genitals (private parts). Wash genitals (private parts) with your normal soap.  6. Wash thoroughly, paying special attention to the area where your surgery will be performed.  7. Thoroughly rinse your body with warm water from the neck down.  8. DO NOT shower/wash with your normal soap after using and rinsing off the CHG Soap.  9. Pat yourself dry with a CLEAN TOWEL.   10. Wear CLEAN PAJAMAS   11. Place CLEAN SHEETS on your bed the night of your first shower and DO NOT SLEEP WITH PETS.    Day of Surgery: Do not apply any deodorants/lotions. Please wear clean clothes to the hospital/surgery center.  Please read over the following fact sheets that you were given. Coughing and Deep Breathing, Total Joint Packet and MRSA Information

## 2017-02-05 NOTE — Progress Notes (Addendum)
Anesthesia Chart Review: Patient is a 76 year old female scheduled for left TKA on 02/10/17 by Dr. Jean Rosenthal.  History includes former smoker, CAD s/p DES mid RCA 07/08/10, hypertension, hypercholesterolemia, GERD, hiatal hernia, TIA 09/07/16, hypothyroidism, post-operative N/V, hysterectomy, cholecystectomy, L4-S2 decompression with lateral fusion 08/2011.    PCP is Dr. Gwenyth Allegra [facility]; see Care Everywhere). He cleared her from a medical standpoint.  Cardiologist is Dr. Blanchie Serve ([facility], see Care Everywhere), last visit 01/07/17. He recommended stress test prior to undergoing knee surgery. This was done 01/19/17 and was non-ischemic.  Meds include Plavix (last dose 02/02/17), aspirin 81 mg, Lipitor, Zyrtec, study, Flonase, levothyroxine, losartan, magnesium oxide, Lopressor, fish oil, Prilosec.    BP (!) 128/58   Pulse 62   Temp 36.7 C   Resp 20   Ht 5\' 4"  (1.626 m)   Wt 177 lb 14.4 oz (80.7 kg)   SpO2 97%   BMI 30.54 kg/m   EKG 09/07/16 [facility]): Result Narrative: Normal sinus rhythm Possible Left atrial enlargement Nonspecific ST and T wave abnormality Abnormal ECG When compared with ECG of 10-Oct-2015 01:39, No significant change was found Confirmed by Arline Asp [phone]) on 09/07/2016 3:21:43 PM  Nuclear stress test 01/19/17 ([place]; Care Everywhere): Summary  Well tolerated Lexiscan perfusion stress test, with no clinical ischemia  during vasodilator stress.  There is normal isotope uptake following Lexiscan injection. There is no  evidence of ischemia.  There is reverse redistribution noted in the apex of unclear significance  Normal LV function with EF of 77 %.  Echocardiogram 0/24/17 [facility]; Care Everywhere): Summary Structurally normal mitral valve. Mild mitral regurgitation. Ejection fraction is visually estimated at 65-70% Normal left ventricular size and systolic function with no appreciable segmental abnormality.  LHC 06/26/11  ([facility]):  Left main normal. Diffuse irregularities LAD. 30% proximal LAD. Left circumflex with 40% mid stenosis. Diffuse irregularities RCA. Previous stent is clear. Proximal RCA 35%. Distal RCA (mid subsection) 40%. Summary: RCA patent stent.  All vessels are calcified ++. No obstructive lesions seen. Normal LV function. Recommendations: Medical therapy because no interventional therapy is required.   Carotid U/S 09/22/16 [facility]; [facility]): Report not viewable in Care Everywhere, but according to 09/22/16 note, "Her carotid duplex scan shows a 1-39% stenosis of the bilateral internal carotid arteries. Plan: We will repeat her carotid duplex scan again in 1 year."  CTA Head/Neck 09/07/16 ([place]; Care Everywhere): Result Impression: CTA NECK - Plaque right carotid bifurcation with less than 50% diameter stenosis proximal right internal carotid artery. - Left carotid bifurcation calcification with 62% diameter stenosis proximal left internal carotid artery. - Codominant vertebral arteries. Mild narrowing proximal left vertebral artery. CTA HEAD - Cavernous segment internal carotid artery calcified plaque with mild to slightly moderate narrowing. Otherwise anterior circulation without medium or large size vessel significant stenosis or occlusion. - Fetal origin of the right posterior cerebral artery. - Ectatic vertebral arteries and basilar artery with mild narrowing distal right vertebral artery. - Minimally bulbous appearance of the basilar tip which is felt to be related to the fetal origin of the right posterior cerebral artery rather than aneurysm. - Mild branch vessel irregularity.  MRI Brain 09/08/16 [facility]; Care Everywhere): Result Impression: Unremarkable exam. No acute infarction.  CT chest 11/18/16 [facility]; Care Everywhere): IMPRESSION: 1. Anterior right lung base nodule of concern on prior study represents an area of right middle lobe  scarring. 2. New 4 mm posterior left lower lobe  pulmonary nodule. No follow-up needed if patient is low-risk. Non-contrast chest CT can be considered in 12 months if patient is high-risk. This recommendation follows the consensus statement: Guidelines for Management of Incidental Pulmonary Nodules Detected on CT Images: From the Fleischner Society 2017; Radiology 2017; 284:228-243. 3. Coronary artery and thoracoabdominal aortic atherosclerosis. 4. Mild intra and extrahepatic biliary distention may be related to prior cholecystectomy. Correlation with liver function test may prove helpful.  Preoperative labs noted. Cr 0.75. Glucose 117. CBC WNL. (Normal LFTs 09/08/16.)  She has recent PCP and cardiology evaluations with medical clearance and non-ischemic stress test. TIA is > 5 months ago (no infarct by MRI). If no acute changes then I anticipate that she can proceed as planned.  George Hugh [facility] Stay [facility]/Anesthesiology Phone [phone] 02/05/2017 4:33 PM

## 2017-02-10 ENCOUNTER — Ambulatory Visit (HOSPITAL_COMMUNITY): Payer: PPO | Admitting: Certified Registered Nurse Anesthetist

## 2017-02-10 ENCOUNTER — Inpatient Hospital Stay (HOSPITAL_COMMUNITY)
Admission: AD | Admit: 2017-02-10 | Discharge: 2017-02-14 | DRG: 470 | Disposition: A | Discharge status: Home Health | Payer: PPO | Source: Ambulatory Visit | Attending: Orthopaedic Surgery | Admitting: Orthopaedic Surgery

## 2017-02-10 ENCOUNTER — Inpatient Hospital Stay (HOSPITAL_COMMUNITY): Payer: PPO

## 2017-02-10 ENCOUNTER — Encounter (HOSPITAL_COMMUNITY): Payer: Self-pay | Admitting: Certified Registered Nurse Anesthetist

## 2017-02-10 ENCOUNTER — Ambulatory Visit (HOSPITAL_COMMUNITY): Payer: PPO | Admitting: Vascular Surgery

## 2017-02-10 ENCOUNTER — Encounter (HOSPITAL_COMMUNITY)
Admission: AD | Disposition: A | Discharge status: Home Health | Payer: Self-pay | Source: Ambulatory Visit | Attending: Orthopaedic Surgery

## 2017-02-10 DIAGNOSIS — Z90710 Acquired absence of both cervix and uterus: Secondary | ICD-10-CM

## 2017-02-10 DIAGNOSIS — Z96652 Presence of left artificial knee joint: Secondary | ICD-10-CM

## 2017-02-10 DIAGNOSIS — E039 Hypothyroidism, unspecified: Secondary | ICD-10-CM | POA: Diagnosis present

## 2017-02-10 DIAGNOSIS — Z955 Presence of coronary angioplasty implant and graft: Secondary | ICD-10-CM

## 2017-02-10 DIAGNOSIS — I2510 Atherosclerotic heart disease of native coronary artery without angina pectoris: Secondary | ICD-10-CM | POA: Diagnosis not present

## 2017-02-10 DIAGNOSIS — E7800 Pure hypercholesterolemia, unspecified: Secondary | ICD-10-CM | POA: Diagnosis present

## 2017-02-10 DIAGNOSIS — Z6830 Body mass index (BMI) 30.0-30.9, adult: Secondary | ICD-10-CM

## 2017-02-10 DIAGNOSIS — G8918 Other acute postprocedural pain: Secondary | ICD-10-CM | POA: Diagnosis not present

## 2017-02-10 DIAGNOSIS — Z8673 Personal history of transient ischemic attack (TIA), and cerebral infarction without residual deficits: Secondary | ICD-10-CM | POA: Diagnosis not present

## 2017-02-10 DIAGNOSIS — D62 Acute posthemorrhagic anemia: Secondary | ICD-10-CM | POA: Diagnosis not present

## 2017-02-10 DIAGNOSIS — Z87891 Personal history of nicotine dependence: Secondary | ICD-10-CM | POA: Diagnosis not present

## 2017-02-10 DIAGNOSIS — E669 Obesity, unspecified: Secondary | ICD-10-CM | POA: Diagnosis not present

## 2017-02-10 DIAGNOSIS — M1712 Unilateral primary osteoarthritis, left knee: Secondary | ICD-10-CM | POA: Diagnosis not present

## 2017-02-10 DIAGNOSIS — G8929 Other chronic pain: Secondary | ICD-10-CM | POA: Diagnosis not present

## 2017-02-10 DIAGNOSIS — Z7951 Long term (current) use of inhaled steroids: Secondary | ICD-10-CM

## 2017-02-10 DIAGNOSIS — M549 Dorsalgia, unspecified: Secondary | ICD-10-CM | POA: Diagnosis not present

## 2017-02-10 DIAGNOSIS — Z79899 Other long term (current) drug therapy: Secondary | ICD-10-CM

## 2017-02-10 DIAGNOSIS — Z7902 Long term (current) use of antithrombotics/antiplatelets: Secondary | ICD-10-CM

## 2017-02-10 DIAGNOSIS — I10 Essential (primary) hypertension: Secondary | ICD-10-CM | POA: Diagnosis not present

## 2017-02-10 DIAGNOSIS — K219 Gastro-esophageal reflux disease without esophagitis: Secondary | ICD-10-CM | POA: Diagnosis not present

## 2017-02-10 DIAGNOSIS — Z7982 Long term (current) use of aspirin: Secondary | ICD-10-CM

## 2017-02-10 DIAGNOSIS — Z471 Aftercare following joint replacement surgery: Secondary | ICD-10-CM | POA: Diagnosis not present

## 2017-02-10 DIAGNOSIS — M25562 Pain in left knee: Secondary | ICD-10-CM | POA: Diagnosis not present

## 2017-02-10 HISTORY — PX: TOTAL KNEE ARTHROPLASTY: SHX125

## 2017-02-10 SURGERY — ARTHROPLASTY, KNEE, TOTAL
Anesthesia: Spinal | Site: Knee | Laterality: Left

## 2017-02-10 MED ORDER — CLINDAMYCIN PHOSPHATE 600 MG/50ML IV SOLN
600.0000 mg | Freq: Four times a day (QID) | INTRAVENOUS | Status: AC
  Administered 2017-02-10 – 2017-02-11 (×2): 600 mg via INTRAVENOUS
  Filled 2017-02-10 (×2): qty 50

## 2017-02-10 MED ORDER — ACETAMINOPHEN 650 MG RE SUPP: 650.0000 mg | Freq: Four times a day (QID) | RECTAL | Status: DC | PRN

## 2017-02-10 MED ORDER — FENTANYL CITRATE (PF) 100 MCG/2ML IJ SOLN
INTRAMUSCULAR | Status: AC
  Administered 2017-02-10: 50 ug
  Filled 2017-02-10: qty 2

## 2017-02-10 MED ORDER — 0.9 % SODIUM CHLORIDE (POUR BTL) OPTIME
TOPICAL | Status: DC | PRN
  Administered 2017-02-10: 1000 mL

## 2017-02-10 MED ORDER — POLYETHYLENE GLYCOL 3350 17 G PO PACK
17.0000 g | PACK | Freq: Every day | ORAL | Status: DC | PRN
  Administered 2017-02-13: 17 g via ORAL
  Filled 2017-02-10: qty 1

## 2017-02-10 MED ORDER — MEPERIDINE HCL 25 MG/ML IJ SOLN: 6.2500 mg | INTRAMUSCULAR | Status: DC | PRN

## 2017-02-10 MED ORDER — FENTANYL CITRATE (PF) 100 MCG/2ML IJ SOLN
INTRAMUSCULAR | Status: AC
  Filled 2017-02-10: qty 2

## 2017-02-10 MED ORDER — LIDOCAINE 2% (20 MG/ML) 5 ML SYRINGE
INTRAMUSCULAR | Status: AC
  Filled 2017-02-10: qty 15

## 2017-02-10 MED ORDER — SODIUM CHLORIDE 0.9 % IR SOLN
Status: DC | PRN
  Administered 2017-02-10: 3000 mL

## 2017-02-10 MED ORDER — PROPOFOL 10 MG/ML IV BOLUS
INTRAVENOUS | Status: AC
  Filled 2017-02-10: qty 20

## 2017-02-10 MED ORDER — MIDAZOLAM HCL 2 MG/2ML IJ SOLN
INTRAMUSCULAR | Status: AC
  Administered 2017-02-10: 1 mg
  Filled 2017-02-10: qty 2

## 2017-02-10 MED ORDER — ATORVASTATIN CALCIUM 10 MG PO TABS
10.0000 mg | ORAL_TABLET | Freq: Every day | ORAL | Status: DC
  Administered 2017-02-10 – 2017-02-13 (×4): 10 mg via ORAL
  Filled 2017-02-10 (×4): qty 1

## 2017-02-10 MED ORDER — LIDOCAINE HCL (CARDIAC) 20 MG/ML IV SOLN
INTRAVENOUS | Status: DC | PRN
  Administered 2017-02-10: 40 mg via INTRATRACHEAL

## 2017-02-10 MED ORDER — CLOPIDOGREL BISULFATE 75 MG PO TABS
75.0000 mg | ORAL_TABLET | Freq: Every day | ORAL | Status: DC
  Administered 2017-02-11 – 2017-02-14 (×4): 75 mg via ORAL
  Filled 2017-02-10 (×4): qty 1

## 2017-02-10 MED ORDER — DOCUSATE SODIUM 100 MG PO CAPS
100.0000 mg | ORAL_CAPSULE | Freq: Two times a day (BID) | ORAL | Status: DC
  Administered 2017-02-10 – 2017-02-13 (×6): 100 mg via ORAL
  Filled 2017-02-10 (×7): qty 1

## 2017-02-10 MED ORDER — MENTHOL 3 MG MT LOZG: 1.0000 | LOZENGE | OROMUCOSAL | Status: DC | PRN

## 2017-02-10 MED ORDER — BUPIVACAINE IN DEXTROSE 0.75-8.25 % IT SOLN
INTRATHECAL | Status: DC | PRN
  Administered 2017-02-10: 1.75 mL via INTRATHECAL

## 2017-02-10 MED ORDER — METHOCARBAMOL 1000 MG/10ML IJ SOLN: 500.0000 mg | Freq: Four times a day (QID) | INTRAVENOUS | Status: DC | PRN

## 2017-02-10 MED ORDER — CHLORHEXIDINE GLUCONATE 4 % EX LIQD: 60.0000 mL | Freq: Once | CUTANEOUS | Status: DC

## 2017-02-10 MED ORDER — VITAMIN B-12 1000 MCG PO TABS
1000.0000 ug | ORAL_TABLET | Freq: Every day | ORAL | Status: DC
  Administered 2017-02-11 – 2017-02-14 (×4): 1000 ug via ORAL
  Filled 2017-02-10 (×4): qty 1

## 2017-02-10 MED ORDER — HYDROMORPHONE HCL 2 MG/ML IJ SOLN
1.0000 mg | INTRAMUSCULAR | Status: DC | PRN
  Administered 2017-02-10 – 2017-02-11 (×4): 1 mg via INTRAVENOUS
  Filled 2017-02-10 (×4): qty 1

## 2017-02-10 MED ORDER — LORATADINE 10 MG PO TABS
10.0000 mg | ORAL_TABLET | Freq: Every day | ORAL | Status: DC
  Administered 2017-02-11 – 2017-02-14 (×4): 10 mg via ORAL
  Filled 2017-02-10 (×4): qty 1

## 2017-02-10 MED ORDER — METHOCARBAMOL 500 MG PO TABS
500.0000 mg | ORAL_TABLET | Freq: Four times a day (QID) | ORAL | Status: DC | PRN
  Administered 2017-02-10 – 2017-02-12 (×5): 500 mg via ORAL
  Filled 2017-02-10 (×4): qty 1

## 2017-02-10 MED ORDER — ACETAMINOPHEN 325 MG PO TABS: 650.0000 mg | ORAL_TABLET | Freq: Four times a day (QID) | ORAL | Status: DC | PRN

## 2017-02-10 MED ORDER — ONDANSETRON HCL 4 MG/2ML IJ SOLN
INTRAMUSCULAR | Status: AC
  Filled 2017-02-10: qty 2

## 2017-02-10 MED ORDER — FENTANYL CITRATE (PF) 100 MCG/2ML IJ SOLN
25.0000 ug | INTRAMUSCULAR | Status: DC | PRN
  Administered 2017-02-10 (×3): 50 ug via INTRAVENOUS

## 2017-02-10 MED ORDER — ONDANSETRON HCL 4 MG PO TABS
4.0000 mg | ORAL_TABLET | Freq: Four times a day (QID) | ORAL | Status: DC | PRN
  Filled 2017-02-10: qty 1

## 2017-02-10 MED ORDER — METOCLOPRAMIDE HCL 5 MG PO TABS: 5.0000 mg | ORAL_TABLET | Freq: Three times a day (TID) | ORAL | Status: DC | PRN

## 2017-02-10 MED ORDER — EZETIMIBE 10 MG PO TABS
10.0000 mg | ORAL_TABLET | Freq: Every day | ORAL | Status: DC
  Administered 2017-02-10 – 2017-02-13 (×4): 10 mg via ORAL
  Filled 2017-02-10 (×4): qty 1

## 2017-02-10 MED ORDER — METOPROLOL TARTRATE 12.5 MG HALF TABLET: 25.0000 mg | ORAL_TABLET | Freq: Once | ORAL | Status: DC

## 2017-02-10 MED ORDER — PANTOPRAZOLE SODIUM 40 MG PO TBEC
40.0000 mg | DELAYED_RELEASE_TABLET | Freq: Every day | ORAL | Status: DC
Start: 2017-02-11 — End: 2017-02-14
  Administered 2017-02-11 – 2017-02-14 (×4): 40 mg via ORAL
  Filled 2017-02-10 (×4): qty 1

## 2017-02-10 MED ORDER — PHENOL 1.4 % MT LIQD: 1.0000 | OROMUCOSAL | Status: DC | PRN

## 2017-02-10 MED ORDER — PROPOFOL 10 MG/ML IV BOLUS
INTRAVENOUS | Status: DC | PRN
  Administered 2017-02-10 (×2): 15 mg via INTRAVENOUS

## 2017-02-10 MED ORDER — PHENYLEPHRINE HCL 10 MG/ML IJ SOLN
INTRAMUSCULAR | Status: DC | PRN
  Administered 2017-02-10 (×2): 120 ug via INTRAVENOUS
  Administered 2017-02-10: 80 ug via INTRAVENOUS

## 2017-02-10 MED ORDER — COENZYME Q10 300 MG PO CAPS: 300.0000 mg | ORAL_CAPSULE | Freq: Every day | ORAL | Status: DC

## 2017-02-10 MED ORDER — SODIUM CHLORIDE 0.9 % IV SOLN
INTRAVENOUS | Status: DC
  Administered 2017-02-10: 17:00:00 via INTRAVENOUS
  Administered 2017-02-12: 75 mL/h via INTRAVENOUS

## 2017-02-10 MED ORDER — PHENYLEPHRINE 40 MCG/ML (10ML) SYRINGE FOR IV PUSH (FOR BLOOD PRESSURE SUPPORT)
PREFILLED_SYRINGE | INTRAVENOUS | Status: AC
  Filled 2017-02-10: qty 10

## 2017-02-10 MED ORDER — ALUM & MAG HYDROXIDE-SIMETH 200-200-20 MG/5ML PO SUSP: 30.0000 mL | ORAL | Status: DC | PRN

## 2017-02-10 MED ORDER — LEVOTHYROXINE SODIUM 75 MCG PO TABS
75.0000 ug | ORAL_TABLET | Freq: Every day | ORAL | Status: DC
  Administered 2017-02-11 – 2017-02-14 (×4): 75 ug via ORAL
  Filled 2017-02-10 (×4): qty 1

## 2017-02-10 MED ORDER — CLINDAMYCIN PHOSPHATE 900 MG/50ML IV SOLN
INTRAVENOUS | Status: AC
  Filled 2017-02-10: qty 50

## 2017-02-10 MED ORDER — CALCIUM CITRATE-VITAMIN D 500-400 MG-UNIT PO CHEW
1.0000 | CHEWABLE_TABLET | Freq: Every day | ORAL | Status: DC
  Filled 2017-02-10: qty 1

## 2017-02-10 MED ORDER — PROPOFOL 500 MG/50ML IV EMUL
INTRAVENOUS | Status: DC | PRN
  Administered 2017-02-10: 100 ug/kg/min via INTRAVENOUS

## 2017-02-10 MED ORDER — OXYCODONE HCL 5 MG PO TABS
5.0000 mg | ORAL_TABLET | ORAL | Status: DC | PRN
  Administered 2017-02-10 – 2017-02-12 (×7): 10 mg via ORAL
  Filled 2017-02-10 (×6): qty 2

## 2017-02-10 MED ORDER — MAGNESIUM OXIDE 250 MG PO TABS: 250.0000 mg | ORAL_TABLET | Freq: Every day | ORAL | Status: DC

## 2017-02-10 MED ORDER — METOPROLOL TARTRATE 25 MG PO TABS
25.0000 mg | ORAL_TABLET | Freq: Two times a day (BID) | ORAL | Status: DC
  Administered 2017-02-10 – 2017-02-14 (×7): 25 mg via ORAL
  Filled 2017-02-10 (×8): qty 1

## 2017-02-10 MED ORDER — LOSARTAN POTASSIUM 50 MG PO TABS
100.0000 mg | ORAL_TABLET | Freq: Every day | ORAL | Status: DC
  Administered 2017-02-12 – 2017-02-14 (×3): 100 mg via ORAL
  Filled 2017-02-10 (×4): qty 2

## 2017-02-10 MED ORDER — FLUTICASONE PROPIONATE 50 MCG/ACT NA SUSP
1.0000 | Freq: Every day | NASAL | Status: DC
  Administered 2017-02-11 – 2017-02-14 (×4): 1 via NASAL
  Filled 2017-02-10: qty 16

## 2017-02-10 MED ORDER — PROMETHAZINE HCL 25 MG/ML IJ SOLN: 6.2500 mg | INTRAMUSCULAR | Status: DC | PRN

## 2017-02-10 MED ORDER — LACTATED RINGERS IV SOLN
INTRAVENOUS | Status: DC
  Administered 2017-02-10 (×2): via INTRAVENOUS

## 2017-02-10 MED ORDER — ROPIVACAINE HCL 7.5 MG/ML IJ SOLN
INTRAMUSCULAR | Status: DC | PRN
  Administered 2017-02-10: 20 mL via PERINEURAL

## 2017-02-10 MED ORDER — ASPIRIN EC 81 MG PO TBEC
81.0000 mg | DELAYED_RELEASE_TABLET | Freq: Every day | ORAL | Status: DC
  Administered 2017-02-10 – 2017-02-13 (×4): 81 mg via ORAL
  Filled 2017-02-10 (×4): qty 1

## 2017-02-10 MED ORDER — KETOROLAC TROMETHAMINE 15 MG/ML IJ SOLN
7.5000 mg | Freq: Four times a day (QID) | INTRAMUSCULAR | Status: DC
  Administered 2017-02-10 – 2017-02-11 (×3): 7.5 mg via INTRAVENOUS
  Filled 2017-02-10 (×3): qty 1

## 2017-02-10 MED ORDER — OXYCODONE HCL 5 MG PO TABS
ORAL_TABLET | ORAL | Status: AC
  Filled 2017-02-10: qty 2

## 2017-02-10 MED ORDER — DIPHENHYDRAMINE HCL 12.5 MG/5ML PO ELIX: 12.5000 mg | ORAL_SOLUTION | ORAL | Status: DC | PRN

## 2017-02-10 MED ORDER — LACTATED RINGERS IV SOLN: INTRAVENOUS | Status: DC

## 2017-02-10 MED ORDER — CALCIUM CARBONATE-VITAMIN D 500-200 MG-UNIT PO TABS
1.0000 | ORAL_TABLET | Freq: Every day | ORAL | Status: DC
  Administered 2017-02-11 – 2017-02-14 (×4): 1 via ORAL
  Filled 2017-02-10 (×5): qty 1

## 2017-02-10 MED ORDER — METOPROLOL TARTRATE 12.5 MG HALF TABLET
ORAL_TABLET | ORAL | Status: AC
  Filled 2017-02-10: qty 2

## 2017-02-10 MED ORDER — ONDANSETRON HCL 4 MG/2ML IJ SOLN
4.0000 mg | Freq: Four times a day (QID) | INTRAMUSCULAR | Status: DC | PRN
Start: 2017-02-10 — End: 2017-02-14
  Administered 2017-02-10 – 2017-02-11 (×2): 4 mg via INTRAVENOUS
  Filled 2017-02-10: qty 2

## 2017-02-10 MED ORDER — METOCLOPRAMIDE HCL 5 MG/ML IJ SOLN
5.0000 mg | Freq: Three times a day (TID) | INTRAMUSCULAR | Status: DC | PRN
  Administered 2017-02-12: 10 mg via INTRAVENOUS
  Filled 2017-02-10: qty 2

## 2017-02-10 MED ORDER — GLYCOPYRROLATE 0.2 MG/ML IJ SOLN
INTRAMUSCULAR | Status: DC | PRN
Start: 2017-02-10 — End: 2017-02-10
  Administered 2017-02-10: .2 mg via INTRAVENOUS

## 2017-02-10 MED ORDER — METHOCARBAMOL 500 MG PO TABS
ORAL_TABLET | ORAL | Status: AC
  Filled 2017-02-10: qty 1

## 2017-02-10 MED ORDER — ROCURONIUM BROMIDE 50 MG/5ML IV SOSY
PREFILLED_SYRINGE | INTRAVENOUS | Status: AC
  Filled 2017-02-10: qty 5

## 2017-02-10 MED ORDER — PHENYLEPHRINE HCL 10 MG/ML IJ SOLN
INTRAVENOUS | Status: DC | PRN
  Administered 2017-02-10: 40 ug/min via INTRAVENOUS

## 2017-02-10 SURGICAL SUPPLY — 67 items
BANDAGE ACE 6X5 VEL STRL LF (GAUZE/BANDAGES/DRESSINGS) ×3 IMPLANT
BANDAGE ESMARK 6X9 LF (GAUZE/BANDAGES/DRESSINGS) ×1 IMPLANT
BEARIN INSERT TIBIAL 11 (Orthopedic Implant) IMPLANT
BEARIN INSERT TIBIAL 11MM (Orthopedic Implant) IMPLANT
BEARIN INSERT TIBIAL 3X13 (Insert) ×2 IMPLANT
BEARIN INSERT TIBIAL 3X13MM (Insert) ×1 IMPLANT
BEARING INSERT TIBIAL 11 (Orthopedic Implant) IMPLANT
BEARING INSERT TIBIAL 3X13 (Insert) ×1 IMPLANT
BLADE SAG 18X100X1.27 (BLADE) ×3 IMPLANT
BNDG ESMARK 6X9 LF (GAUZE/BANDAGES/DRESSINGS) ×3
BOWL SMART MIX CTS (DISPOSABLE) ×3 IMPLANT
CAPT KNEE TOTAL 3 ×3 IMPLANT
CEMENT BONE SIMPLEX SPEEDSET (Cement) ×6 IMPLANT
CLOSURE WOUND 1/2 X4 (GAUZE/BANDAGES/DRESSINGS) ×1
COVER SURGICAL LIGHT HANDLE (MISCELLANEOUS) ×3 IMPLANT
CUFF TOURNIQUET SINGLE 34IN LL (TOURNIQUET CUFF) ×3 IMPLANT
CUFF TOURNIQUET SINGLE 44IN (TOURNIQUET CUFF) IMPLANT
DRAPE EXTREMITY T 121X128X90 (DRAPE) ×3 IMPLANT
DRAPE PROXIMA HALF (DRAPES) ×3 IMPLANT
DRAPE U-SHAPE 47X51 STRL (DRAPES) ×3 IMPLANT
DRSG PAD ABDOMINAL 8X10 ST (GAUZE/BANDAGES/DRESSINGS) ×3 IMPLANT
DURAPREP 26ML APPLICATOR (WOUND CARE) ×3 IMPLANT
ELECT CAUTERY BLADE 6.4 (BLADE) ×3 IMPLANT
ELECT REM PT RETURN 9FT ADLT (ELECTROSURGICAL) ×3
ELECTRODE REM PT RTRN 9FT ADLT (ELECTROSURGICAL) ×1 IMPLANT
FACESHIELD WRAPAROUND (MASK) ×9 IMPLANT
GAUZE SPONGE 4X4 12PLY STRL (GAUZE/BANDAGES/DRESSINGS) ×3 IMPLANT
GAUZE XEROFORM 1X8 LF (GAUZE/BANDAGES/DRESSINGS) ×3 IMPLANT
GLOVE BIOGEL PI IND STRL 8 (GLOVE) ×2 IMPLANT
GLOVE BIOGEL PI INDICATOR 8 (GLOVE) ×4
GLOVE ORTHO TXT STRL SZ7.5 (GLOVE) ×3 IMPLANT
GLOVE SURG ORTHO 8.0 STRL STRW (GLOVE) ×3 IMPLANT
GLOVE SURG SS PI 6.5 STRL IVOR (GLOVE) ×6 IMPLANT
GOWN STRL REUS W/ TWL LRG LVL3 (GOWN DISPOSABLE) IMPLANT
GOWN STRL REUS W/ TWL XL LVL3 (GOWN DISPOSABLE) ×2 IMPLANT
GOWN STRL REUS W/TWL LRG LVL3 (GOWN DISPOSABLE)
GOWN STRL REUS W/TWL XL LVL3 (GOWN DISPOSABLE) ×4
HANDPIECE INTERPULSE COAX TIP (DISPOSABLE) ×2
IMMOBILIZER KNEE 22 UNIV (SOFTGOODS) ×3 IMPLANT
KIT BASIN OR (CUSTOM PROCEDURE TRAY) ×3 IMPLANT
KIT ROOM TURNOVER OR (KITS) ×3 IMPLANT
MANIFOLD NEPTUNE II (INSTRUMENTS) ×3 IMPLANT
NDL SAFETY ECLIPSE 18X1.5 (NEEDLE) IMPLANT
NEEDLE HYPO 18GX1.5 SHARP (NEEDLE)
NS IRRIG 1000ML POUR BTL (IV SOLUTION) ×3 IMPLANT
PACK TOTAL JOINT (CUSTOM PROCEDURE TRAY) ×3 IMPLANT
PAD ABD 8X10 STRL (GAUZE/BANDAGES/DRESSINGS) ×3 IMPLANT
PAD ARMBOARD 7.5X6 YLW CONV (MISCELLANEOUS) ×3 IMPLANT
PADDING CAST COTTON 6X4 STRL (CAST SUPPLIES) ×3 IMPLANT
SET HNDPC FAN SPRY TIP SCT (DISPOSABLE) ×1 IMPLANT
SET PAD KNEE POSITIONER (MISCELLANEOUS) ×6 IMPLANT
STAPLER VISISTAT 35W (STAPLE) IMPLANT
STRIP CLOSURE SKIN 1/2X4 (GAUZE/BANDAGES/DRESSINGS) ×2 IMPLANT
SUCTION FRAZIER HANDLE 10FR (MISCELLANEOUS) ×2
SUCTION TUBE FRAZIER 10FR DISP (MISCELLANEOUS) ×1 IMPLANT
SUT MNCRL AB 4-0 PS2 18 (SUTURE) IMPLANT
SUT VIC AB 0 CT1 27 (SUTURE) ×2
SUT VIC AB 0 CT1 27XBRD ANBCTR (SUTURE) ×1 IMPLANT
SUT VIC AB 1 CT1 27 (SUTURE) ×4
SUT VIC AB 1 CT1 27XBRD ANBCTR (SUTURE) ×2 IMPLANT
SUT VIC AB 2-0 CT1 27 (SUTURE) ×6
SUT VIC AB 2-0 CT1 TAPERPNT 27 (SUTURE) ×3 IMPLANT
SYR 50ML LL SCALE MARK (SYRINGE) IMPLANT
TOWEL OR 17X24 6PK STRL BLUE (TOWEL DISPOSABLE) ×3 IMPLANT
TOWEL OR 17X26 10 PK STRL BLUE (TOWEL DISPOSABLE) ×3 IMPLANT
TRAY CATH 16FR W/PLASTIC CATH (SET/KITS/TRAYS/PACK) ×3 IMPLANT
WRAP KNEE MAXI GEL POST OP (GAUZE/BANDAGES/DRESSINGS) ×3 IMPLANT

## 2017-02-10 NOTE — Anesthesia Postprocedure Evaluation (Addendum)
Anesthesia Post Note  Patient: Sandra Abbott  Procedure(s) Performed: Procedure(s) (LRB): LEFT TOTAL KNEE ARTHROPLASTY (Left)  Patient location during evaluation: PACU Anesthesia Type: Spinal Level of consciousness: oriented and awake and alert Pain management: pain level controlled Vital Signs Assessment: post-procedure vital signs reviewed and stable Respiratory status: spontaneous breathing, respiratory function stable and patient connected to nasal cannula oxygen Cardiovascular status: blood pressure returned to baseline and stable Postop Assessment: no headache and no backache Anesthetic complications: no       Last Vitals:  Vitals:   02/10/17 1545 02/10/17 1600  BP: (!) 103/58 104/88  Pulse: 61 (!) 58  Resp: 12 13  Temp:      Last Pain:  Vitals:   02/10/17 1554  TempSrc:   PainSc: Asleep                 Marice Angelino,JAMES TERRILL

## 2017-02-10 NOTE — Anesthesia Preprocedure Evaluation (Addendum)
Anesthesia Evaluation  Patient identified by MRN, date of birth, ID band Patient awake    Reviewed: Allergy & Precautions, NPO status , Patient's Chart, lab work & pertinent test results, reviewed documented beta blocker date and time   History of Anesthesia Complications (+) PONV and history of anesthetic complications  Airway Mallampati: II  TM Distance: >3 FB Neck ROM: Full    Dental no notable dental hx. (+) Teeth Intact, Caps   Pulmonary former smoker,    Pulmonary exam normal breath sounds clear to auscultation       Cardiovascular hypertension, Pt. on medications and Pt. on home beta blockers + CAD and + Cardiac Stents  Normal cardiovascular exam Rhythm:Regular Rate:Normal  CAD- Stent RCA, LAD 30%, L Cx 30-40%, 30% RCA distal to stent.   Neuro/Psych CVA, No Residual Symptoms negative psych ROS   GI/Hepatic Neg liver ROS, hiatal hernia, GERD  Medicated and Controlled,  Endo/Other  Hypothyroidism Hypercholesterolemia  Renal/GU negative Renal ROS  negative genitourinary   Musculoskeletal  (+) Arthritis , Osteoarthritis,  OA left knee Chronic back pain- S/P lumbar laminectomy   Abdominal (+) + obese,   Peds  Hematology Plavix - last dose 1 week ago ASA 81mg   - last dose 2 days ago   Anesthesia Other Findings   Reproductive/Obstetrics                            Lab Results  Component Value Date   WBC 6.0 02/03/2017   HGB 12.6 02/03/2017   HCT 38.0 02/03/2017   MCV 91.8 02/03/2017   PLT 225 02/03/2017     Chemistry      Component Value Date/Time   NA 139 02/03/2017 1155   K 3.7 02/03/2017 1155   CL 104 02/03/2017 1155   CO2 24 02/03/2017 1155   BUN 12 02/03/2017 1155   CREATININE 0.75 02/03/2017 1155      Component Value Date/Time   CALCIUM 9.1 02/03/2017 1155     EKG: normal sinus rhythm, nonspecific ST and T waves changes. Echo 08/2016: EF 50-55%, Normal  echo.  Anesthesia Physical Anesthesia Plan  ASA: III  Anesthesia Plan: Spinal   Post-op Pain Management:  [facility] for Post-op pain   Induction:   Airway Management Planned: Natural Airway and Simple Face Mask  Additional Equipment:   Intra-op Plan:   Post-operative Plan:   Informed Consent: I have reviewed the patients History and Physical, chart, labs and discussed the procedure including the risks, benefits and alternatives for the proposed anesthesia with the patient or authorized representative who has indicated his/her understanding and acceptance.   Dental advisory given  Plan Discussed with: CRNA, Anesthesiologist and Surgeon  Anesthesia Plan Comments:         Anesthesia Quick Evaluation

## 2017-02-10 NOTE — Anesthesia Procedure Notes (Signed)
Spinal  Patient location during procedure: OR Start time: 02/10/2017 12:05 PM End time: 02/10/2017 12:18 PM Staffing Anesthesiologist: Rica Koyanagi Performed: anesthesiologist  Preanesthetic Checklist Completed: patient identified, site marked, surgical consent, pre-op evaluation, timeout performed, IV checked, risks and benefits discussed and monitors and equipment checked Spinal Block Patient position: sitting Prep: Betadine Patient monitoring: heart rate, cardiac monitor, continuous pulse ox and blood pressure Approach: midline Location: L3-4 Injection technique: single-shot Needle Needle type: Pencan  Needle gauge: 24 G Assessment Sensory level: T8

## 2017-02-10 NOTE — Progress Notes (Signed)
Orthopedic Tech Progress Note Patient Details:  LERONDA LEWERS [birth year] [id number]  CPM Left Knee CPM Left Knee: On Left Knee Flexion (Degrees): 90 Left Knee Extension (Degrees): 0  Ortho Devices Ortho Device/Splint Location: applied ohf to bed Ortho Device/Splint Interventions: Ordered, Application, Adjustment   Braulio Bosch 02/10/2017, 4:13 PM

## 2017-02-10 NOTE — Anesthesia Procedure Notes (Signed)
Procedure Name: MAC Date/Time: 02/10/2017 12:03 PM Performed by: Salli Quarry Cherrish Vitali Pre-anesthesia Checklist: Patient identified, Emergency Drugs available, Suction available and Patient being monitored Patient Re-evaluated:Patient Re-evaluated prior to inductionOxygen Delivery Method: Nasal cannula

## 2017-02-10 NOTE — Progress Notes (Addendum)
Patients initial BP was 225/85, HR 67.  She normally takes Losartan 100mg  & Metoprolol 25mg  daily Additional BP taken 25 min later, 211/66. Spoke with Dr. Royce Macadamia, he has ordered an extra 25mg  of Metoprolol.  He will come visit. Was going to give extra Metoprolol, hr was 55-57, bp was 180/67.  Dr. Royce Macadamia made aware.  Extra Metoprolol held.  DA

## 2017-02-10 NOTE — Transfer of Care (Signed)
Immediate Anesthesia Transfer of Care Note  Patient: Sandra Abbott  Procedure(s) Performed: Procedure(s): LEFT TOTAL KNEE ARTHROPLASTY (Left)  Patient Location: PACU  Anesthesia Type:MAC combined with regional for post-op pain  Level of Consciousness: awake, alert , oriented and patient cooperative  Airway & Oxygen Therapy: Patient Spontanous Breathing and Patient connected to nasal cannula oxygen  Post-op Assessment: Report given to RN and Post -op Vital signs reviewed and stable  Post vital signs: Reviewed and stable  Last Vitals:  Vitals:   02/10/17 1130 02/10/17 1135  BP:  (!) 154/60  Pulse: 63 62  Resp: 15 11  Temp:      Last Pain:  Vitals:   02/10/17 1011  TempSrc: Oral         Complications: No apparent anesthesia complications

## 2017-02-10 NOTE — H&P (Signed)
TOTAL KNEE ADMISSION H&P  Patient is being admitted for left total knee arthroplasty.  Subjective:  Chief Complaint:left knee pain.  HPI: Sandra Abbott, 76 y.o. female, has a history of pain and functional disability in the left knee due to arthritis and has failed non-surgical conservative treatments for greater than 12 weeks to includeNSAID's and/or analgesics, corticosteriod injections, use of assistive devices, weight reduction as appropriate and activity modification.  Onset of symptoms was gradual, starting 1 years ago with gradually worsening course since that time. The patient noted no past surgery on the left knee(s).  Patient currently rates pain in the left knee(s) at 10 out of 10 with activity. Patient has night pain, worsening of pain with activity and weight bearing, pain that interferes with activities of daily living, pain with passive range of motion, crepitus and joint swelling.  Patient has evidence of subchondral sclerosis, periarticular osteophytes and joint space narrowing by imaging studies. There is no active infection.  Patient Active Problem List   Diagnosis Date Noted  . Unilateral primary osteoarthritis, left knee 01/05/2017   Past Medical History:  Diagnosis Date  . Arthritis   . Coronary artery disease   . GERD (gastroesophageal reflux disease)   . High cholesterol   . High cholesterol   . History of hiatal hernia   . Hypertension   . Hypothyroidism   . PONV (postoperative nausea and vomiting)   . 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 ABDOMINAL HYSTERECTOMY    . BACK SURGERY    . BLADDER SURGERY    . CHOLECYSTECTOMY    . CORONARY ANGIOPLASTY WITH STENT PLACEMENT      Prescriptions Prior to Admission  Medication Sig Dispense Refill Last Dose  . acetaminophen (TYLENOL) 325 MG tablet Take 650 mg by mouth every 6 (six) hours as needed for mild pain or moderate pain.   Past Week at Unknown time  .  aspirin 81 MG tablet Take 81 mg by mouth at bedtime.    Past Week at Unknown time  . atorvastatin (LIPITOR) 10 MG tablet Take 10 mg by mouth at bedtime.    02/09/2017 at Unknown time  . Calcium Carb-Cholecalciferol (CALCIUM/VITAMIN D) 600-400 MG-UNIT TABS Take 1 tablet by mouth daily.   Past Week at Unknown time  . cetirizine (ZYRTEC) 10 MG tablet Take 10 mg by mouth daily.   02/10/2017 at 0730  . clopidogrel (PLAVIX) 75 MG tablet TAKE 1 TABLET BY MOUTH EVERY DAY   Past Week at Unknown time  . Coenzyme Q10 300 MG CAPS Take 300 mg by mouth daily.   Past Week at Unknown time  . ezetimibe (ZETIA) 10 MG tablet Take 10 mg by mouth at bedtime.    02/09/2017 at Unknown time  . fluticasone (FLONASE) 50 MCG/ACT nasal spray Place 1 spray into both nostrils daily.   02/10/2017 at Unknown time  . levothyroxine (SYNTHROID, LEVOTHROID) 75 MCG tablet Take 75 mcg by mouth daily before breakfast.   02/09/2017 at Unknown time  . losartan (COZAAR) 100 MG tablet Take 100 mg by mouth daily.    02/09/2017 at Unknown time  . Magnesium Oxide 250 MG TABS Take 250 mg by mouth at bedtime.   Past Week at Unknown time  . metoprolol tartrate (LOPRESSOR) 25 MG tablet Take 25 mg by mouth 2 (two) times daily.   02/10/2017 at 0730  . Omega-3 Fatty Acids (FISH OIL) 1000 MG CAPS  Take 1,000 mg by mouth daily.   Past Week at Unknown time  . omeprazole (PRILOSEC) 40 MG capsule Take 40 mg by mouth daily.   02/10/2017 at 0730  . vitamin B-12 (CYANOCOBALAMIN) 1000 MCG tablet Take 1,000 mcg by mouth daily.    Past Week at Unknown time  . HYDROcodone-acetaminophen (NORCO/VICODIN) 5-325 MG per tablet Take 2 tablets by mouth every 4 (four) hours as needed. 16 tablet 0 Not Taking   Allergies  Allergen Reactions  . Cefdinir Itching, Rash and Other (See Comments)    CHEST PAIN  . Mirabegron Swelling    LIPS, REQUIRED TRIP TO ED  . Penicillins Anaphylaxis, Hives and Swelling    Tongue swells and has chest pains Has patient had a PCN reaction  causing immediate rash, facial/tongue/throat swelling, SOB or lightheadedness with hypotension: Yes Has patient had a PCN reaction causing severe rash involving mucus membranes or skin necrosis: Yes Has patient had a PCN reaction that required hospitalization No Has patient had a PCN reaction occurring within the last 10 years: Yes If all of the above answers are "NO", then may proceed with Cephalosporin use.   . Sulfa Antibiotics Anaphylaxis, Hives and Swelling    TONGUE CHEST PAIN  . Sulfamethoxazole-Trimethoprim Hives and Swelling    CHEST PAIN  . Codeine Nausea And Vomiting and Nausea Only  . Lisinopril Diarrhea and Cough  . Valproic Acid Nausea Only    Social History  Substance Use Topics  . Smoking status: Former Smoker    Quit date: 05/06/1988  . Smokeless tobacco: Never Used  . Alcohol use No    History reviewed. No pertinent family history.   Review of Systems  Musculoskeletal: Positive for joint pain.  All other systems reviewed and are negative.   Objective:  Physical Exam  Constitutional: She is oriented to person, place, and time. She appears well-developed and well-nourished.  HENT:  Head: Normocephalic and atraumatic.  Eyes: EOM are normal. Pupils are equal, round, and reactive to light.  Neck: Normal range of motion. Neck supple.  Cardiovascular: Normal rate and regular rhythm.   Respiratory: Effort normal and breath sounds normal.  GI: Soft. Bowel sounds are normal.  Musculoskeletal:       Left knee: She exhibits decreased range of motion, swelling and effusion. Tenderness found. Medial joint line and lateral joint line tenderness noted.  Neurological: She is alert and oriented to person, place, and time.  Skin: Skin is warm and dry.  Psychiatric: She has a normal mood and affect.    Vital signs in last 24 hours: Temp:  [97.9 F (36.6 C)] 97.9 F (36.6 C) (03/27 1011) Pulse Rate:  [62-69] 62 (03/27 1135) Resp:  [11-19] 11 (03/27 1135) BP:  (154-225)/(60-85) 154/60 (03/27 1135) SpO2:  [96 %-100 %] 97 % (03/27 1135) Weight:  [177 lb 14.4 oz (80.7 kg)] 177 lb 14.4 oz (80.7 kg) (03/27 1011)  Labs:   Estimated body mass index is 30.54 kg/m as calculated from the following:   Height as of this encounter: 5\' 4"  (1.626 m).   Weight as of this encounter: 177 lb 14.4 oz (80.7 kg).   Imaging Review Plain radiographs demonstrate severe degenerative joint disease of the left knee(s). The overall alignment isneutral. The bone quality appears to be good for age and reported activity level.  Assessment/Plan:  End stage arthritis, left knee   The patient history, physical examination, clinical judgment of the provider and imaging studies are consistent with end stage degenerative joint  disease of the left knee(s) and total knee arthroplasty is deemed medically necessary. The treatment options including medical management, injection therapy arthroscopy and arthroplasty were discussed at length. The risks and benefits of total knee arthroplasty were presented and reviewed. The risks due to aseptic loosening, infection, stiffness, patella tracking problems, thromboembolic complications and other imponderables were discussed. The patient acknowledged the explanation, agreed to proceed with the plan and consent was signed. Patient is being admitted for inpatient treatment for surgery, pain control, PT, OT, prophylactic antibiotics, VTE prophylaxis, progressive ambulation and ADL's and discharge planning. The patient is planning to be discharged home with home health services

## 2017-02-10 NOTE — Brief Op Note (Signed)
02/10/2017  1:37 PM  PATIENT:  Sandra Abbott  76 y.o. female  PRE-OPERATIVE DIAGNOSIS:  osteoarthritis left knee  POST-OPERATIVE DIAGNOSIS:  osteoarthritis left knee  PROCEDURE:  Procedure(s): LEFT TOTAL KNEE ARTHROPLASTY (Left)  SURGEON:  Surgeon(s) and Role:    * Mcarthur Rossetti, MD - Primary  PHYSICIAN ASSISTANT: Benita Stabile, PA-C  ANESTHESIA:   spinal, regional  EBL:  Total I/O In: 1000 [I.V.:1000] Out: 125 [Urine:125]  COUNTS:  YES  TOURNIQUET:   Total Tourniquet Time Documented: Thigh (Left) - 51 minutes Total: Thigh (Left) - 51 minutes   DICTATION: .Other Dictation: Dictation Number [id number]  PLAN OF CARE: Admit to inpatient   PATIENT DISPOSITION:  PACU - hemodynamically stable.   Delay start of Pharmacological VTE agent (>24hrs) due to surgical blood loss or risk of bleeding: no

## 2017-02-10 NOTE — Anesthesia Procedure Notes (Addendum)
Anesthesia Regional Block: Adductor canal block   Pre-Anesthetic Checklist: ,, timeout performed, Correct Patient, Correct Site, Correct Laterality, Correct Procedure, Correct Position, site marked, Risks and benefits discussed,  Surgical consent,  Pre-op evaluation,  At surgeon's request and post-op pain management  Laterality: Left and Lower  Prep: chloraprep       Needles:      Needle Length: 9cm  Needle Gauge: 21   Needle insertion depth: 6 cm   Additional Needles:   Procedures: ultrasound guided,,,,,,,,  Narrative:  Start time: 02/10/2017 11:25 AM End time: 02/10/2017 11:33 AM Injection made incrementally with aspirations every 5 mL.  Performed by: Personally  Anesthesiologist: Michaell Grider

## 2017-02-11 ENCOUNTER — Encounter (HOSPITAL_COMMUNITY): Payer: Self-pay | Admitting: Orthopaedic Surgery

## 2017-02-11 LAB — BASIC METABOLIC PANEL
Anion gap: 11 (ref 5–15)
BUN: 17 mg/dL (ref 6–20)
CALCIUM: 8.1 mg/dL — AB (ref 8.9–10.3)
CO2: 26 mmol/L (ref 22–32)
CREATININE: 1.88 mg/dL — AB (ref 0.44–1.00)
Chloride: 101 mmol/L (ref 101–111)
GFR calc non Af Amer: 25 mL/min — ABNORMAL LOW (ref >60)
GFR, EST AFRICAN AMERICAN: 29 mL/min — AB (ref >60)
Glucose, Bld: 154 mg/dL — ABNORMAL HIGH (ref 65–99)
Potassium: 4.8 mmol/L (ref 3.5–5.1)
SODIUM: 138 mmol/L (ref 135–145)

## 2017-02-11 LAB — CBC
HCT: 29.8 % — ABNORMAL LOW (ref 36.0–46.0)
Hemoglobin: 9.7 g/dL — ABNORMAL LOW (ref 12.0–15.0)
MCH: 30.3 pg (ref 26.0–34.0)
MCHC: 32.6 g/dL (ref 30.0–36.0)
MCV: 93.1 fL (ref 78.0–100.0)
PLATELETS: 203 10*3/uL (ref 150–400)
RBC: 3.2 MIL/uL — ABNORMAL LOW (ref 3.87–5.11)
RDW: 13.2 % (ref 11.5–15.5)
WBC: 9 10*3/uL (ref 4.0–10.5)

## 2017-02-11 MED ORDER — METOCLOPRAMIDE HCL 5 MG/ML IJ SOLN: 10.0000 mg | Freq: Three times a day (TID) | INTRAMUSCULAR | Status: DC | PRN

## 2017-02-11 MED ORDER — PROMETHAZINE HCL 25 MG/ML IJ SOLN
12.5000 mg | Freq: Four times a day (QID) | INTRAMUSCULAR | Status: DC | PRN
  Administered 2017-02-11: 12.5 mg via INTRAVENOUS
  Filled 2017-02-11: qty 1

## 2017-02-11 NOTE — Evaluation (Signed)
Physical Therapy Evaluation Patient Details Name: Sandra Abbott MRN: [id number] DOB: 09/18/1941 Today's Date: 02/11/2017   History of Present Illness  Admitted for LTKA, WBAT;  has a past medical history of Arthritis; Coronary artery disease Hypothyroidism; PONV (postoperative nausea and vomiting); and Stroke (Sandra Abbott).  has a past surgical history that includes Coronary angioplasty with stent;  Back surgery  Clinical Impression   Pt is s/p TKA resulting in the deficits listed below (see PT Problem List). Activity tolerance significantly limited by lethargy, nausea, lightheadedness on eval -- likely due to medications; RN aware; Will continue to monitor for progress; DC home with [facility] follow up is still quite appropriate as 24 hours can make a huge difference, and elective TKA patients usually progress well once meds are dialed in;  Pt will benefit from skilled PT to increase their independence and safety with mobility to allow discharge to the venue listed below.      Follow Up Recommendations Home health PT;Supervision/Assistance - 24 hour    Equipment Recommendations  Rolling walker with 5" wheels;3in1 (PT)    Recommendations for Other Services OT consult     Precautions / Restrictions Precautions Precautions: Knee;Fall Required Braces or Orthoses: Knee Immobilizer - Left Knee Immobilizer - Left: Other (comment) (not in order set, but in room and helpful) Restrictions Weight Bearing Restrictions: Yes LLE Weight Bearing: Weight bearing as tolerated  Pt; husband educated to not allow any pillow or bolster under knee for healing with optimal range of motion.      Mobility  Bed Mobility Overal bed mobility: Needs Assistance Bed Mobility: Supine to Sit;Sit to Supine     Supine to sit: Max assist;+2 for physical assistance Sit to supine: Max assist;+2 for physical assistance   General bed mobility comments: 2 person max assist to help Sandra Abbott up to sit at EOB; she was able to  participate somewhat, pulling up to sit with handheld assist and second person supporting back and trunk coming to sit; used bed pad to square off hips at EOB; Max assist to lay back down to supine  Transfers                 General transfer comment: Held transfer OOB at this time due to feeling very lightheaded and throwing up  Ambulation/Gait             General Gait Details: Held amb this session due to very lightheaded and throwing up  Stairs            Wheelchair Mobility    Modified Rankin (Stroke Patients Only)       Balance Overall balance assessment: Needs assistance Sitting-balance support: Feet supported;Bilateral upper extremity supported Sitting balance-Leahy Scale: Poor Sitting balance - Comments: Support at back while sitting EOB; tolerated approx 2 minutes sitting up, then reported significant lightheadedness and began to throw up                                     Pertinent Vitals/Pain Pain Assessment: Faces Faces Pain Scale: Hurts little more Pain Location: L knee with PROM Pain Descriptors / Indicators: Grimacing Pain Intervention(s): Monitored during session    Home Living Family/patient expects to be discharged to:: Private residence Living Arrangements: Spouse/significant other Available Help at Discharge: Family;Available 24 hours/day Type of Home: House Home Access: Stairs to enter Entrance Stairs-Rails: None Entrance Stairs-Number of Steps: 1 Home Layout: One level  Home Equipment: Shower seat;Hand held shower head      Prior Function Level of Independence: Independent               Hand Dominance        Extremity/Trunk Assessment   Upper Extremity Assessment Upper Extremity Assessment: Generalized weakness    Lower Extremity Assessment Lower Extremity Assessment: LLE deficits/detail LLE Deficits / Details: Very sleepy, and therefore did not note appreciable active movement of LLE; PROM approx  0-85 deg       Communication   Communication: Other (comment) (Very sleepy throughout session)  Cognition Arousal/Alertness: Lethargic;Suspect due to medications Behavior During Therapy: Flat affect Overall Cognitive Status: Impaired/Different from baseline Area of Impairment: Attention                   Current Attention Level: Sustained           General Comments: Eyes open approx 20% of session      General Comments General comments (skin integrity, edema, etc.): BP after laying back down 127/48, HR 90; it is possible that pt's BP was lower sitting up; possible orthostasis    Exercises     Assessment/Plan    PT Assessment Patient needs continued PT services  PT Problem List Decreased strength;Decreased range of motion;Decreased activity tolerance;Decreased balance;Decreased mobility;Decreased cognition;Decreased knowledge of use of DME;Decreased safety awareness;Decreased knowledge of precautions;Pain       PT Treatment Interventions DME instruction;Gait training;Stair training;Functional mobility training;Therapeutic activities;Therapeutic exercise;Patient/family education    PT Goals (Current goals can be found in the Care Plan section)  Acute Rehab PT Goals Patient Stated Goal: Did not state PT Goal Formulation: With patient Time For Goal Achievement: 02/18/17 Potential to Achieve Goals: Fair    Frequency 7X/week   Barriers to discharge        Co-evaluation               End of Session Equipment Utilized During Treatment: Gait belt;Left knee immobilizer Activity Tolerance: Patient limited by fatigue;Patient limited by lethargy;Other (comment) (throwing up and lightheaded) Patient left: in bed;with call bell/phone within reach;with family/visitor present Nurse Communication: Mobility status (and response to activity) PT Visit Diagnosis: Muscle weakness (generalized) (M62.81);Other abnormalities of gait and mobility (R26.89);Pain Pain -  Right/Left: Left Pain - part of body: Knee    Time: 0851-0910 PT Time Calculation (min) (ACUTE ONLY): 19 min   Charges:   PT Evaluation $PT Eval Moderate Complexity: 1 Procedure     PT G Codes:        Roney Marion, PT  Acute Rehabilitation Services Pager [phone] Office [phone]   [place]ta [place] 02/11/2017, 10:37 AM

## 2017-02-11 NOTE — Progress Notes (Signed)
Subjective: 1 Day Post-Op Procedure(s) (LRB): LEFT TOTAL KNEE ARTHROPLASTY (Left) Patient reports pain as moderate.  Acute blood loss anemia from surgery; vitals stable but bump in creatinine.  Objective: Vital signs in last 24 hours: Temp:  [97.6 F (36.4 C)-98.7 F (37.1 C)] 98.7 F (37.1 C) (03/28 0539) Pulse Rate:  [56-74] 64 (03/28 0549) Resp:  [11-19] 14 (03/28 0539) BP: (77-225)/(27-88) 92/54 (03/28 0628) SpO2:  [94 %-100 %] 94 % (03/28 0539) Weight:  [177 lb 14.4 oz (80.7 kg)] 177 lb 14.4 oz (80.7 kg) (03/27 1011)  Intake/Output from previous day: 03/27 0701 - 03/28 0700 In: 1757.5 [P.O.:240; I.V.:1517.5] Out: 735 [Urine:655; Blood:80] Intake/Output this shift: No intake/output data recorded.   Recent Labs  02/11/17 0242  HGB 9.7*    Recent Labs  02/11/17 0242  WBC 9.0  RBC 3.20*  HCT 29.8*  PLT 203    Recent Labs  02/11/17 0242  NA 138  K 4.8  CL 101  CO2 26  BUN 17  CREATININE 1.88*  GLUCOSE 154*  CALCIUM 8.1*   No results for input(s): LABPT, INR in the last 72 hours.  Sensation intact distally Intact pulses distally Dorsiflexion/Plantar flexion intact Incision: dressing C/D/I Compartment soft  Assessment/Plan: 1 Day Post-Op Procedure(s) (LRB): LEFT TOTAL KNEE ARTHROPLASTY (Left) Up with therapy  Mcarthur Rossetti 02/11/2017, 7:08 AM

## 2017-02-11 NOTE — Op Note (Signed)
NAME:  Sandra Abbott, Sandra Abbott                     ACCOUNT NO.:  MEDICAL RECORD NO.:  [id number]  LOCATION:                                 FACILITY:  PHYSICIAN:  Lind Guest. Ninfa Linden, M.D.DATE OF BIRTH:  DATE OF PROCEDURE:  02/10/2017 DATE OF DISCHARGE:                              OPERATIVE REPORT   PREOPERATIVE DIAGNOSIS:  Primary osteoarthritis and degenerative joint disease, left knee.  POSTOPERATIVE DIAGNOSIS:  Primary osteoarthritis and degenerative joint disease, left knee.  PROCEDURE:  Left total knee arthroplasty.  IMPLANTS:  Stryker Triathlon knee with size 3 femur, size 3 tibial tray, 13 mm fix-bearing polyethylene insert, size 29 patellar button.  SURGEON:  Lind Guest. Ninfa Linden, M.D.  ASSISTANT:  Erskine Emery, PA-C.  ANESTHESIA: 1. Left lower extremity adductor canal block. 2. Spinal.  TOURNIQUET TIME:  Under 1 hour.  BLOOD LOSS:  Less than 200 mL.  ANTIBIOTICS:  900 mg of IV clindamycin.  COMPLICATIONS:  None.  INDICATIONS:  [name] is a 76 year old patient well known to me.  She has had debilitating pain involving her left knee with x-rays that showed minimal deformity and just mild-to-moderate arthritic changes. We did obtain an MRI due to the symptoms she was having in her knee and it showed areas of full-thickness cartilage loss throughout the knee as well as chronic degenerative tearing of the medial and lateral meniscus. Given these findings and the failure of conservative treatment, we recommended a total knee arthroplasty.  She does wish to proceed with this due to her decreased quality of life including her decreased mobility and her increased pain.  She understands the goals of surgery are improved mobility, improved quality of life, and decreased pain. She understands the risks of acute blood loss anemia, nerve and vessel injury, fracture, infection, and DVT.  PROCEDURE DESCRIPTION:  After informed consent was obtained, appropriate left  lower extremity was marked.  Anesthesia obtained a lower extremity adductor canal block.  She was then brought to the operating room. Spinal anesthesia was obtained.  She was laid supine on the operating table.  A Foley catheter was placed and then a nonsterile tourniquet was placed around her upper left thigh.  Her left leg was prepped and draped from the thigh down the toes with DuraPrep and sterile drapes.  Time-out was called to identify correct patient, correct left knee.  We then used an Esmarch to wrap out the leg and tourniquet was inflated to 300 mm of pressure.  We then made a direct midline incision over the patella and carried this proximally and distally.  We dissected down the knee joint and performed a medial parapatellar arthrotomy finding a very large joint effusion, significant synovitis in the knee, and there was a full- thickness cartilage loss in all 3 compartments.  With the knee in a flexed position, we then set our extramedullary cutting guide for making our tibia cut.  We took this for taking 9 mm off the high side correcting for varus and valgus and neutral slope.  We made this cut without difficulty.  We then went to the femur and used the intramedullary guide for the femur through the notch of  the femur.  We set our distal femoral cut for cutting guide for the left knee at 5 degrees externally rotated for an 8 mm distal femoral cut.  We made this cut without difficulty and brought the knee back down to full extension. We removed final remnants to medial and lateral meniscus as well.  We then put our 9-mm extension block pin, she actually hyperextended a little bit.  We then went back to the femur and put our femoral sizing guide based off the epicondylar axis.  We chose a size 3 femur.  We put our 4-in-1 cutting block for a size 3 femur and made our anterior and posterior cuts followed by our chamfer cuts.  We then made our femoral box cut off a 3 femur trial.   We then went to the tibia side and we set our tibial tray rotation off the femur and the tibial tubercle.  Using a size 3 tibia, we made our keel cut off this.  With the trial 3 tibia and a 3 femur in place, we trialed 11 mm fix-bearing polyethylene insert. We were pleased with stability.  We then removed the trial inserts and irrigated the knee with normal saline solution.  We then cut our patella and drilled 3 holes for size 29 patellar button.  We then mixed our cement and cemented the real Stryker Triathlon tibial tray size 3, followed by the real size 3 femur.  We cemented our 29 patellar button and then placed our 11 mm fix-bearing polyethylene insert.  Once the cement had hardened, we put the knee through range of motion and I felt like it was just slightly loose so we actually removed the size 11 polyethylene and went with a size 13 and I was then pleased with stability with the 13.  We then irrigated the knee with normal saline solution using pulsatile lavage.  We closed the arthrotomy with interrupted #1 Vicryl suture followed by 0 Vicryl in the deep tissue, 2- 0 Vicryl in the subcutaneous tissue, 4-0 Monocryl subcuticular stitch, and Steri-Strips on the skin.  Well-padded sterile dressing was applied. She was taken to the recovery room in stable condition.  All final counts were correct.  There were no complications noted.  Of note, Erskine Emery, PA-C assisted in the entire case.  His assistance was crucial for facilitating all aspects of this case.     Lind Guest. Ninfa Linden, M.D.     CYB/MEDQ  D:  02/10/2017  T:  02/10/2017  Job:  [id number]

## 2017-02-11 NOTE — Progress Notes (Signed)
qPhysical Therapy Treatment Patient Details Name: Sandra Abbott MRN: [id number] DOB: Dec 03, 1940 Today's Date: 02/11/2017    History of Present Illness Admitted for LTKA, WBAT;  has a past medical history of Arthritis; Coronary artery disease Hypothyroidism; PONV (postoperative nausea and vomiting); and Stroke ([place]).  has a past surgical history that includes Coronary angioplasty with stent;  Back surgery    PT Comments    Able to incr activity a little bit, but still with nausea and +emesis during session; Hopeful for good progress as pt's nausea and vomiting gets under control   Follow Up Recommendations  Home health PT;Supervision/Assistance - 24 hour     Equipment Recommendations       Recommendations for Other Services OT consult     Precautions / Restrictions Precautions Precautions: Knee;Fall Required Braces or Orthoses: Knee Immobilizer - Left Knee Immobilizer - Left: Other (comment) (not in order set, but in room and helpful) Restrictions LLE Weight Bearing: Weight bearing as tolerated    Mobility  Bed Mobility Overal bed mobility: Needs Assistance Bed Mobility: Supine to Sit     Supine to sit: Mod assist     General bed mobility comments: Cues for technique; heavy mod assist to elevate trunk to sit; use of bed pad to square off hips at EOB  Transfers Overall transfer level: Needs assistance Equipment used: 2 person hand held assist Transfers: Stand Pivot Transfers   Stand pivot transfers: +2 safety/equipment;Mod assist       General transfer comment: Basic stand pivot transfer bed to chair; opted for quick transfer due to pt's decr activity tolerance and N/V  Ambulation/Gait             General Gait Details: Held amb this session due to very lightheaded and throwing up   Stairs            Wheelchair Mobility    Modified Rankin (Stroke Patients Only)       Balance     Sitting balance-Leahy Scale: Fair                                      Cognition Arousal/Alertness: Awake/alert Behavior During Therapy: Flat affect Overall Cognitive Status: Within Functional Limits for tasks assessed                                 General Comments: Much more awake this session      Exercises Total Joint Exercises Quad Sets: AROM;Left;10 reps Heel Slides: AAROM;Left;10 reps Straight Leg Raises: AROM;Left;10 reps    General Comments        Pertinent Vitals/Pain Pain Assessment: Faces Faces Pain Scale: Hurts a little bit Pain Location: L knee with PROM Pain Descriptors / Indicators: Grimacing Pain Intervention(s): Monitored during session    Home Living                      Prior Function            PT Goals (current goals can now be found in the care plan section) Acute Rehab PT Goals Patient Stated Goal: Did not state PT Goal Formulation: With patient Time For Goal Achievement: 02/18/17 Potential to Achieve Goals: Fair Progress towards PT goals: Progressing toward goals    Frequency    7X/week      PT Plan Current plan  remains appropriate    Co-evaluation             End of Session Equipment Utilized During Treatment: Gait belt;Left knee immobilizer Activity Tolerance: Patient limited by fatigue;Patient limited by lethargy;Other (comment) (throwing up and lightheaded) Patient left: in chair;with call bell/phone within reach;with family/visitor present Nurse Communication: Mobility status PT Visit Diagnosis: Muscle weakness (generalized) (M62.81);Other abnormalities of gait and mobility (R26.89);Pain Pain - Right/Left: Left Pain - part of body: Knee     Time: 6269-4854 PT Time Calculation (min) (ACUTE ONLY): 21 min  Charges:  $Therapeutic Activity: 8-22 mins                    G Codes:       Roney Marion, PT  Acute Rehabilitation Services Pager [phone] Office [phone]    Colletta [place] 02/11/2017, 4:21 PM

## 2017-02-11 NOTE — Progress Notes (Signed)
Orthopedic Tech Progress Note Patient Details:  Sandra Abbott 1941/04/11 [id number] On cpm at [place] Patient ID: [name], female   DOB: 01/16/1941, 76 y.o.   MRN: [id number]   Braulio Bosch 02/11/2017, 6:19 PM

## 2017-02-12 LAB — BASIC METABOLIC PANEL
Anion gap: 7 (ref 5–15)
BUN: 22 mg/dL — ABNORMAL HIGH (ref 6–20)
CHLORIDE: 102 mmol/L (ref 101–111)
CO2: 27 mmol/L (ref 22–32)
Calcium: 7.8 mg/dL — ABNORMAL LOW (ref 8.9–10.3)
Creatinine, Ser: 0.95 mg/dL (ref 0.44–1.00)
GFR calc non Af Amer: 57 mL/min — ABNORMAL LOW (ref >60)
Glucose, Bld: 121 mg/dL — ABNORMAL HIGH (ref 65–99)
POTASSIUM: 4.1 mmol/L (ref 3.5–5.1)
SODIUM: 136 mmol/L (ref 135–145)

## 2017-02-12 LAB — CBC
HEMATOCRIT: 25.5 % — AB (ref 36.0–46.0)
Hemoglobin: 8.3 g/dL — ABNORMAL LOW (ref 12.0–15.0)
MCH: 30.2 pg (ref 26.0–34.0)
MCHC: 32.5 g/dL (ref 30.0–36.0)
MCV: 92.7 fL (ref 78.0–100.0)
Platelets: 168 10*3/uL (ref 150–400)
RBC: 2.75 MIL/uL — ABNORMAL LOW (ref 3.87–5.11)
RDW: 13.3 % (ref 11.5–15.5)
WBC: 9.3 10*3/uL (ref 4.0–10.5)

## 2017-02-12 LAB — GLUCOSE, CAPILLARY: GLUCOSE-CAPILLARY: 111 mg/dL — AB (ref 65–99)

## 2017-02-12 MED ORDER — ORAL CARE MOUTH RINSE: 15.0000 mL | Freq: Two times a day (BID) | OROMUCOSAL | Status: DC

## 2017-02-12 MED ORDER — TRAMADOL HCL 50 MG PO TABS
100.0000 mg | ORAL_TABLET | Freq: Four times a day (QID) | ORAL | Status: DC | PRN
  Administered 2017-02-12: 100 mg via ORAL
  Filled 2017-02-12: qty 2

## 2017-02-12 MED ORDER — HYDROCODONE-ACETAMINOPHEN 5-325 MG PO TABS
1.0000 | ORAL_TABLET | ORAL | Status: DC | PRN
  Administered 2017-02-12: 2 via ORAL
  Filled 2017-02-12: qty 2

## 2017-02-12 NOTE — Progress Notes (Signed)
qPhysical Therapy Treatment Patient Details Name: Sandra Abbott MRN: [id number] DOB: 04-12-1941 Today's Date: 02/12/2017    History of Present Illness Admitted for LTKA, WBAT;  has a past medical history of Arthritis; Coronary artery disease Hypothyroidism; PONV (postoperative nausea and vomiting); and Stroke ([place]).  has a past surgical history that includes Coronary angioplasty with stent;  Back surgery    PT Comments    Patient lethargic and with O2 desat down to 68% on RA. BP 134/47 in sitting. RN aware and in to assess pt during session. 2-4L O2 via [place] donned during session with pt on 4L end of session and SpO2 96% in supine. Continue to progress as tolerated.   Follow Up Recommendations  Home health PT;Supervision/Assistance - 24 hour     Equipment Recommendations  Rolling walker with 5" wheels;3in1 (PT)    Recommendations for Other Services OT consult     Precautions / Restrictions Precautions Precautions: Knee;Fall Required Braces or Orthoses: Knee Immobilizer - Left Knee Immobilizer - Left: Other (comment) (not in order set, but in room and helpful) Restrictions Weight Bearing Restrictions: Yes LLE Weight Bearing: Weight bearing as tolerated    Mobility  Bed Mobility Overal bed mobility: Needs Assistance Bed Mobility: Sit to Supine     Supine to sit: Mod assist (Simultaneous filing. User may not have seen previous data.) Sit to supine: Max assist;+2 for physical assistance   General bed mobility comments: cues for sequencing; +2 assist to lower trunk and bring bilat LE into bed  Transfers Overall transfer level: Needs assistance Equipment used: Rolling walker (2 wheeled) Transfers: Sit to/from [facility] Sit to Stand: Mod assist;+2 physical assistance Stand pivot transfers: Max assist;+2 physical assistance       General transfer comment: mod A +2 to power up into standing and assist for balance upon standing from recliner and BSC with  multimodal cues for safe hand placement; stand pivot from BSC to EOB with max A +2 to maintain balance and manage RW; pt unable to take steps backwards toward bed and bed brought up behind pt by RN  Ambulation/Gait Ambulation/Gait assistance: Mod assist;+2 physical assistance;+2 safety/equipment Ambulation Distance (Feet):  (34ft then 52ft) Assistive device: Rolling walker (2 wheeled) Gait Pattern/deviations: Step-to pattern;Decreased step length - right;Decreased step length - left Gait velocity: slow   General Gait Details: pt able to take a few steps until [facility] brought up behind pt; +2 assist for balance and RW managment with pt demonstrating R lateral lean and short step lengths   Stairs            Wheelchair Mobility    Modified Rankin (Stroke Patients Only)       Balance     Sitting balance-Leahy Scale: Fair                                      Cognition Arousal/Alertness: Lethargic Behavior During Therapy: Flat affect Overall Cognitive Status: Impaired/Different from baseline Area of Impairment: Following commands;Problem solving;Attention                   Current Attention Level: Focused   Following Commands: Follows one step commands inconsistently;Follows one step commands with increased time     Problem Solving: Slow processing;Decreased initiation;Requires verbal cues;Difficulty sequencing;Requires tactile cues General Comments: Pt very lethargic        Exercises      General Comments General  comments (skin integrity, edema, etc.): BP in sitting 134/47, HR 91, and SpO2 from between 68-74% on RA; RN notified and 2L O2 via [place] donned and ultimately pt with 4L O2 end of session and SpO2 96%; RN came in to assess pt during session      Pertinent Vitals/Pain Pain Assessment: Faces Faces Pain Scale: Hurts little more Pain Location: L knee  Pain Descriptors / Indicators: Guarding;Grimacing Pain Intervention(s): Limited activity  within patient's tolerance;Monitored during session    Home Living Family/patient expects to be discharged to:: (P) Private residence Living Arrangements: (P) Spouse/significant other Available Help at Discharge: (P) Family;Available 24 hours/day Type of Home: (P) House Home Access: (P) Stairs to enter Entrance Stairs-Rails: (P) None Home Layout: (P) One level Home Equipment: (P) Shower seat;Hand held shower head      Prior Function Level of Independence: (P) Independent          PT Goals (current goals can now be found in the care plan section) Acute Rehab PT Goals Patient Stated Goal: Did not state PT Goal Formulation: With patient Time For Goal Achievement: 02/18/17 Potential to Achieve Goals: Fair Progress towards PT goals: Not progressing toward goals - comment    Frequency    7X/week      PT Plan Current plan remains appropriate    Co-evaluation PT/OT/SLP Co-Evaluation/Treatment: Yes Reason for Co-Treatment: Necessary to address cognition/behavior during functional activity;For patient/therapist safety PT goals addressed during session: Mobility/safety with mobility;Proper use of DME       End of Session Equipment Utilized During Treatment: Gait belt;Left knee immobilizer;Oxygen;Other (comment) (2-4 L O2 via [place] ) Activity Tolerance: Patient limited by lethargy;Treatment limited secondary to medical complications (Comment) (O2 desat ) Patient left: in chair;with call bell/phone within reach Nurse Communication: Mobility status PT Visit Diagnosis: Muscle weakness (generalized) (M62.81);Other abnormalities of gait and mobility (R26.89);Pain Pain - Right/Left: Left Pain - part of body: Knee     Time: 2122-4825 PT Time Calculation (min) (ACUTE ONLY): 35 min  Charges:  $Gait Training: 8-22 mins $Therapeutic Activity: 8-22 mins                    G Codes:       Earney Navy, PTA Pager: [phone]     Darliss Cheney 02/12/2017, 4:01 PM

## 2017-02-12 NOTE — Progress Notes (Signed)
Occupational Therapy Evaluation:   Pt presents to OT with the below listed diagnosis and deficits.  She was very lethargic and distractible during OT session.  She was only able to follow ~40% of commands with max multimodal cues and was sluggish with Lt UE use (inconsistent).  She requires total A overall for ADLs.   Anticipate she may need SNF.  Recommend SNF   02/12/17 1538  OT Visit Information  Last OT Received On 02/12/17  Assistance Needed +2 (safety--chair push)  History of Present Illness Admitted for LTKA, WBAT;  has a past medical history of Arthritis; Coronary artery disease Hypothyroidism; PONV (postoperative nausea and vomiting); and Stroke ([place]).  has a past surgical history that includes Coronary angioplasty with stent;  Back surgery  Precautions  Precautions Knee;Fall  Required Braces or Orthoses Knee Immobilizer - Left  Knee Immobilizer - Left Other (comment) (not in order set, but in room and helpful)  Restrictions  Weight Bearing Restrictions Yes  LLE Weight Bearing WBAT  Home Living  Family/patient expects to be discharged to: Private residence  Living Arrangements Spouse/significant other  Available Help at Discharge Family;Available 24 hours/day  Type of [place] to enter  Entrance Stairs-Number of Steps 1  Entrance Stairs-Rails None  Home Layout One level  Bathroom Shower/Tub Walk-in Warden/ranger seat;Hand held shower head  Prior Function  Level of Independence Independent  Communication  Communication Other (comment) (slow to respond )  Pain Assessment  Pain Assessment Faces  Faces Pain Scale 4  Pain Location L knee   Pain Descriptors / Indicators Guarding;Grimacing  Pain Intervention(s) Limited activity within patient's tolerance;Monitored during session  Cognition  Arousal/Alertness Lethargic  Behavior During Therapy Flat affect  Overall Cognitive Status Impaired/Different from  baseline  Area of Impairment Following commands;Problem solving;Attention  Current Attention Level Focused  Following Commands Follows one step commands inconsistently;Follows one step commands with increased time  Problem Solving Slow processing;Decreased initiation;Requires verbal cues;Difficulty sequencing;Requires tactile cues  General Comments Pt very lethargic.  She will intermittently intiiate short conversation, but very limited.  she will initiate movement in response to a command/instruction, but unable to complete it.  She requires max multi modal cues to follow commands ~40% of the time.    Upper Extremity Assessment  Upper Extremity Assessment LUE deficits/detail  LUE Deficits / Details Pt reports h/o "shoulder issues"  states she needs a replacement.  She is sluggish with movement Lt UE when asked to use it, but is inconsistent with responses.  Elbow distally grossly 4-/5.     Lower Extremity Assessment  Lower Extremity Assessment Defer to PT evaluation  Cervical / Trunk Assessment  Cervical / Trunk Assessment Normal  ADL  Overall ADL's  Needs assistance/impaired  Eating/Feeding Maximal assistance;Sitting  Eating/Feeding Details (indicate cue type and reason) Upon enterance, pt attempting to drink tea from cup, but the lid was on, and pt with no awareness of this   Grooming Wash/dry face;Wash/dry hands;Oral care;Brushing hair;Maximal assistance;Sitting  Upper Body Bathing Total assistance;Sitting  Lower Body Bathing Total assistance;Sitting/lateral leans  Upper Body Dressing  Total assistance;Sitting  Lower Body Dressing Total assistance;Sitting/lateral leans  Toilet Transfer Total assistance  Toilet Transfer Details (indicate cue type and reason) unable to safely attempt with +1 assist   Toileting- Clothing Manipulation and Hygiene Total assistance;Sitting/lateral lean  Functional mobility during ADLs Maximal assistance;+2 for physical assistance;Moderate assistance  General  ADL Comments Pt very lethargic and very distractable  during eval - unable to sustain attention to complete ADL task   Transfers  General transfer comment mod A from EOB and min A from [facility]; cues for safe hand placement and use of AD (Simultaneous filing. User may not have seen previous data.)  Balance  Sitting balance-Leahy Scale Fair  General Comments  General comments (skin integrity, edema, etc.) RN notified of pt behaviors and lethargy during eval   OT - End of Session  Equipment Utilized During Treatment Left knee immobilizer  Activity Tolerance Patient limited by lethargy;Patient limited by fatigue  Patient left in chair;with call bell/phone within reach  Nurse Communication Mobility status;Other (comment) (cognition )  CPM Left Knee  CPM Left Knee Off  OT Assessment  OT Recommendation/Assessment Patient needs continued OT Services  OT Visit Diagnosis Pain;Muscle weakness (generalized) (M62.81)  Pain - Right/Left Left  Pain - part of body Knee  OT Problem List Decreased strength;Decreased activity tolerance;Impaired balance (sitting and/or standing);Decreased safety awareness;Decreased knowledge of use of DME or AE;Decreased knowledge of precautions;Obesity;Pain  Barriers to Discharge Decreased caregiver support  Barriers to Discharge Comments unsure if family able to provide necessary amount of assist   OT Plan  OT Frequency (ACUTE ONLY) Min 2X/week  OT Treatment/Interventions (ACUTE ONLY) Self-care/ADL training;DME and/or AE instruction;Therapeutic activities;Patient/family education;Balance training  AM-PAC OT "6 Clicks" Daily Activity Outcome Measure  Help from another person eating meals? 2  Help from another person taking care of personal grooming? 2  Help from another person toileting, which includes using toliet, bedpan, or urinal? 1  Help from another person bathing (including washing, rinsing, drying)? 1  Help from another person to put on and taking off regular upper  body clothing? 1  Help from another person to put on and taking off regular lower body clothing? 1  6 Click Score 8  ADL G Code Conversion CM  OT Recommendation  Follow Up Recommendations SNF;Supervision/Assistance - 24 hour  OT Equipment 3 in 1 bedside commode  Individuals Consulted  Consulted and Agree with Results and Recommendations Patient  Acute Rehab OT Goals  Patient Stated Goal Did not state (Simultaneous filing. User may not have seen previous data.)  OT Goal Formulation With patient  Time For Goal Achievement 02/19/17  Potential to Achieve Goals Good  OT Time Calculation  OT Start Time (ACUTE ONLY) 1420  OT Stop Time (ACUTE ONLY) 1440  OT Time Calculation (min) 20 min  OT General Charges  $OT Visit 1 Procedure  OT Evaluation  $OT Eval Moderate Complexity 1 Procedure  Lucille Passy, OTR/L [phone]

## 2017-02-12 NOTE — Progress Notes (Signed)
Occupational Therapy Progress Note  Pt seen with PT to assist pt to [facility] and transfer back to bed.  She requires mod - max A +2 for transfers.   She remains lethargic.  BP 134/47; HR 91; 02 sats 68-74% on RA.  02 titrated to 4L to keep 02 sats >92%.   RN aware and present during session  Recommend SNF.   02/12/17 1616  OT Visit Information  Last OT Received On 02/12/17  Assistance Needed +2 (safety--chair push)  PT/OT/SLP Co-Evaluation/Treatment Yes  Reason for Co-Treatment Complexity of the patient's impairments (multi-system involvement);For patient/therapist safety;Necessary to address cognition/behavior during functional activity  OT goals addressed during session ADL's and self-care  History of Present Illness Admitted for LTKA, WBAT;  has a past medical history of Arthritis; Coronary artery disease Hypothyroidism; PONV (postoperative nausea and vomiting); and Stroke ([place]).  has a past surgical history that includes Coronary angioplasty with stent;  Back surgery  Precautions  Precautions Knee;Fall  Required Braces or Orthoses Knee Immobilizer - Left  Knee Immobilizer - Left Other (comment) (not in order set, but in room and helpful)  Pain Assessment  Faces Pain Scale 4  Pain Location L knee   Pain Descriptors / Indicators Guarding;Grimacing  Cognition  Arousal/Alertness Lethargic  Behavior During Therapy Flat affect  Overall Cognitive Status Impaired/Different from baseline  Area of Impairment Following commands;Problem solving;Attention  Current Attention Level Focused  Following Commands Follows one step commands inconsistently;Follows one step commands with increased time  Problem Solving Slow processing;Decreased initiation;Requires verbal cues;Difficulty sequencing;Requires tactile cues  General Comments Pt remains lethargic., slow to follow commands.  She is oriented with min cues for time   ADL  Overall ADL's  Needs assistance/impaired  Toilet Transfer Maximal  assistance;+2 for physical assistance;BSC;RW  Toileting- Clothing Manipulation and Hygiene Sit to/from stand;Total assistance  Functional mobility during ADLs Maximal assistance;+2 for physical assistance;Moderate assistance  General ADL Comments Pt limited by lethargy   Bed Mobility  Overal bed mobility Needs Assistance  Bed Mobility Sit to Supine  Sit to supine Max assist;+2 for physical assistance  General bed mobility comments cues for sequencing; +2 assist to lower trunk and bring bilat LE into bed  Balance  Sitting balance-Leahy Scale Fair  Restrictions  Weight Bearing Restrictions Yes  LLE Weight Bearing WBAT  Transfers  Overall transfer level Needs assistance  Equipment used Rolling walker (2 wheeled)  Transfers Sit to/from Stand;Stand Pivot Transfers  Sit to Stand Mod assist;+2 physical assistance  Stand pivot transfers Max assist;+2 physical assistance  General transfer comment mod A +2 to power up into standing and assist for balance upon standing from recliner and BSC with multimodal cues for safe hand placement; stand pivot from BSC to EOB with max A +2 to maintain balance and manage RW; pt unable to take steps backwards toward bed and bed brought up behind pt by RN  General Comments  General comments (skin integrity, edema, etc.) BP in sitting 134/47, HR 91, and SpO2 from between 68-74% on RA; RN notified and 2L O2 via [place] donned and ultimately pt with 4L O2 end of session and SpO2 96%; RN came in to assess pt during session  OT - End of Session  Equipment Utilized During Treatment Left knee immobilizer  Activity Tolerance Patient limited by lethargy;Patient limited by fatigue  Patient left with call bell/phone within reach;in bed;with bed alarm set  Nurse Communication Mobility status;Other (comment) (02 sats )  OT Assessment/Plan  OT Visit Diagnosis Pain;Muscle weakness (generalized) (  M62.81)  Pain - Right/Left Left  Pain - part of body Knee  OT Frequency (ACUTE ONLY)  Min 2X/week  Follow Up Recommendations SNF;Supervision/Assistance - 24 hour  OT Equipment 3 in 1 bedside commode  AM-PAC OT "6 Clicks" Daily Activity Outcome Measure  Help from another person eating meals? 2  Help from another person taking care of personal grooming? 2  Help from another person toileting, which includes using toliet, bedpan, or urinal? 1  Help from another person bathing (including washing, rinsing, drying)? 1  Help from another person to put on and taking off regular upper body clothing? 1  Help from another person to put on and taking off regular lower body clothing? 1  6 Click Score 8  ADL G Code Conversion CM  Acute Rehab OT Goals  Patient Stated Goal to get better   OT Goal Formulation With patient  Time For Goal Achievement 02/19/17  Potential to Achieve Goals Good  OT Time Calculation  OT Start Time (ACUTE ONLY) 1452  OT Stop Time (ACUTE ONLY) 1527  OT Time Calculation (min) 35 min  OT General Charges  $OT Visit 1 Procedure  OT Treatments  $Self Care/Home Management  8-22 mins  [facility], OTR/L [phone]

## 2017-02-12 NOTE — Progress Notes (Signed)
Orthopedic Tech Progress Note Patient Details:  Sandra Abbott [birth year] [id number] On cpm at 1920 Patient ID: Sandra Abbott, female   DOB: 1941/04/27, 76 y.o.   MRN: [id number]   Braulio Bosch 02/12/2017, 7:21 PM

## 2017-02-12 NOTE — Progress Notes (Signed)
Subjective: 2 Days Post-Op Procedure(s) (LRB): LEFT TOTAL KNEE ARTHROPLASTY (Left) Patient reports pain as moderate.  H/H stable.  Nausea somewhat less this am.  Objective: Vital signs in last 24 hours: Temp:  [98 F (36.7 C)-98.7 F (37.1 C)] 98.7 F (37.1 C) (03/29 0406) Pulse Rate:  [70-89] 89 (03/29 0931) Resp:  [16] 16 (03/29 0406) BP: (110-136)/(21-47) 136/47 (03/29 0931) SpO2:  [95 %-98 %] 95 % (03/29 0406)  Intake/Output from previous day: 03/28 0701 - 03/29 0700 In: 3060 [P.O.:360; I.V.:2700] Out: 404 [Urine:400; Emesis/NG output:3; Stool:1] Intake/Output this shift: No intake/output data recorded.   Recent Labs  02/11/17 0242 02/12/17 0759  HGB 9.7* 8.3*    Recent Labs  02/11/17 0242 02/12/17 0759  WBC 9.0 9.3  RBC 3.20* 2.75*  HCT 29.8* 25.5*  PLT 203 168    Recent Labs  02/11/17 0242 02/12/17 0759  NA 138 136  K 4.8 4.1  CL 101 102  CO2 26 27  BUN 17 22*  CREATININE 1.88* 0.95  GLUCOSE 154* 121*  CALCIUM 8.1* 7.8*   No results for input(s): LABPT, INR in the last 72 hours.  Sensation intact distally Intact pulses distally Dorsiflexion/Plantar flexion intact Incision: scant drainage No cellulitis present Compartment soft  Assessment/Plan: 2 Days Post-Op Procedure(s) (LRB): LEFT TOTAL KNEE ARTHROPLASTY (Left) Up with therapy Plan for discharge tomorrow Discharge home with home health  Sandra Abbott 02/12/2017, 9:55 AM

## 2017-02-12 NOTE — Progress Notes (Signed)
qPhysical Therapy Treatment Patient Details Name: Sandra Abbott MRN: [id number] DOB: 1940-12-09 Today's Date: 02/12/2017    History of Present Illness Admitted for LTKA, WBAT;  has a past medical history of Arthritis; Coronary artery disease Hypothyroidism; PONV (postoperative nausea and vomiting); and Stroke ([place]).  has a past surgical history that includes Coronary angioplasty with stent;  Back surgery    PT Comments    Patient limited by nausea, dizziness, and lethargy. Pt able to ambulate into bathroom and became progressively more lethargic and weak when ambulating out of bathroom. RN aware. Continue to progress as tolerated.   Follow Up Recommendations  Home health PT;Supervision/Assistance - 24 hour     Equipment Recommendations  Rolling walker with 5" wheels;3in1 (PT)    Recommendations for Other Services OT consult     Precautions / Restrictions Precautions Precautions: Knee;Fall Required Braces or Orthoses: Knee Immobilizer - Left Knee Immobilizer - Left: Other (comment) (not in order set, but in room and helpful) Restrictions Weight Bearing Restrictions: Yes LLE Weight Bearing: Weight bearing as tolerated    Mobility  Bed Mobility Overal bed mobility: Needs Assistance Bed Mobility: Supine to Sit     Supine to sit: Mod assist     General bed mobility comments: increased time; cues for sequencing and technique; assist to elevate trunk into sitting and to bring L LE to EOB  Transfers Overall transfer level: Needs assistance Equipment used: Rolling walker (2 wheeled) Transfers: Sit to/from Stand Sit to Stand: Min assist;Mod assist;From elevated surface         General transfer comment: mod A from EOB and min A from [facility]; cues for safe hand placement and use of AD  Ambulation/Gait Ambulation/Gait assistance: Min assist;Mod assist Ambulation Distance (Feet):  (105ft then 54ft) Assistive device: Rolling walker (2 wheeled) Gait Pattern/deviations: Step-to  pattern;Decreased stance time - left;Decreased step length - right;Decreased weight shift to left;Trunk flexed Gait velocity: slow   General Gait Details: cues for proximity of RW, sequencing, and posture; pt with c/o nausea and dizziness ambulating out of bathroom and pallor; pt required assist for balance and safe descent to recliner as well as maintaining eyes open   Stairs            Wheelchair Mobility    Modified Rankin (Stroke Patients Only)       Balance     Sitting balance-Leahy Scale: Fair                                      Cognition Arousal/Alertness: Lethargic;Suspect due to medications Behavior During Therapy: Flat affect Overall Cognitive Status: Impaired/Different from baseline Area of Impairment: Following commands;Problem solving                       Following Commands: Follows one step commands with increased time     Problem Solving: Slow processing;Decreased initiation;Requires verbal cues;Difficulty sequencing        Exercises      General Comments General comments (skin integrity, edema, etc.): unable to get BP--RN aware of nausea and dizziness      Pertinent Vitals/Pain Pain Assessment: Faces Faces Pain Scale: Hurts little more Pain Location: L knee  Pain Descriptors / Indicators: Guarding;Grimacing Pain Intervention(s): Limited activity within patient's tolerance;Monitored during session;Premedicated before session;Repositioned    Home Living  Prior Function            PT Goals (current goals can now be found in the care plan section) Acute Rehab PT Goals Patient Stated Goal: Did not state PT Goal Formulation: With patient Time For Goal Achievement: 02/18/17 Potential to Achieve Goals: Fair Progress towards PT goals: Progressing toward goals    Frequency    7X/week      PT Plan Current plan remains appropriate    Co-evaluation             End of  Session Equipment Utilized During Treatment: Gait belt;Left knee immobilizer Activity Tolerance: Patient limited by fatigue;Patient limited by lethargy;Other (comment) (nausea and dizziness) Patient left: in chair;with call bell/phone within reach Nurse Communication: Mobility status PT Visit Diagnosis: Muscle weakness (generalized) (M62.81);Other abnormalities of gait and mobility (R26.89);Pain Pain - Right/Left: Left Pain - part of body: Knee     Time: 1139-1212 PT Time Calculation (min) (ACUTE ONLY): 33 min  Charges:  $Gait Training: 8-22 mins $Therapeutic Activity: 8-22 mins                    G Codes:       Earney Navy, PTA Pager: [phone]     Darliss Cheney 02/12/2017, 2:33 PM

## 2017-02-12 NOTE — Discharge Instructions (Signed)

## 2017-02-12 NOTE — Progress Notes (Signed)
Called into pt's room by OT d/t pt's lethergy and O2 level. CBG and vitals taken, and pt placed on 2L-[place]. Pt able to get up to 1750 on IS. Called Dr. Ninfa Linden with an update, and received orders to discontinue to Tramadol and Robaxin. Orders placed, pt updated, and verbalized understanding. Will continue to monitor.

## 2017-02-13 LAB — CBC
HCT: 20.4 % — ABNORMAL LOW (ref 36.0–46.0)
HEMOGLOBIN: 7 g/dL — AB (ref 12.0–15.0)
MCH: 31.4 pg (ref 26.0–34.0)
MCHC: 33.8 g/dL (ref 30.0–36.0)
MCV: 92.7 fL (ref 78.0–100.0)
Platelets: 142 10*3/uL — ABNORMAL LOW (ref 150–400)
RBC: 2.2 MIL/uL — AB (ref 3.87–5.11)
RDW: 13.6 % (ref 11.5–15.5)
WBC: 7.6 10*3/uL (ref 4.0–10.5)

## 2017-02-13 LAB — PREPARE RBC (CROSSMATCH)

## 2017-02-13 LAB — ABO/RH: ABO/RH(D): O POS

## 2017-02-13 MED ORDER — PROMETHAZINE HCL 12.5 MG PO TABS: 12.5000 mg | ORAL_TABLET | Freq: Four times a day (QID) | ORAL | 0 refills | Status: AC | PRN

## 2017-02-13 MED ORDER — HYDROCODONE-ACETAMINOPHEN 5-325 MG PO TABS: 1.0000 | ORAL_TABLET | ORAL | 0 refills | Status: AC | PRN

## 2017-02-13 MED ORDER — FUROSEMIDE 10 MG/ML IJ SOLN
20.0000 mg | Freq: Once | INTRAMUSCULAR | Status: AC
  Administered 2017-02-13: 20 mg via INTRAVENOUS
  Filled 2017-02-13: qty 2

## 2017-02-13 MED ORDER — SODIUM CHLORIDE 0.9 % IV SOLN
Freq: Once | INTRAVENOUS | Status: AC
  Administered 2017-02-13: 10:00:00 via INTRAVENOUS

## 2017-02-13 MED ORDER — HYDROMORPHONE HCL 1 MG/ML IJ SOLN: 1.0000 mg | INTRAMUSCULAR | Status: DC | PRN

## 2017-02-13 NOTE — Progress Notes (Signed)
Orthopedic Tech Progress Note Patient Details:  Sandra Abbott 1941/01/16 [id number]  CPM Left Knee CPM Left Knee: On Left Knee Flexion (Degrees): 60 Left Knee Extension (Degrees): 0   Sandra Abbott 02/13/2017, 2:10 PM

## 2017-02-13 NOTE — Progress Notes (Signed)
qPhysical Therapy Treatment Patient Details Name: Sandra Abbott MRN: [id number] DOB: [birth year] Today's Date: 02/13/2017    History of Present Illness Admitted for LTKA, WBAT;  has a past medical history of Arthritis; Coronary artery disease Hypothyroidism; PONV (postoperative nausea and vomiting); and Stroke ([place]).  has a past surgical history that includes Coronary angioplasty with stent;  Back surgery    PT Comments    The  Patient has received 1 unit of prior to this visit. (Hgb 7.0). The patient is more alert and mobilizing better. Does complain of fatigue with OOB to [facility] and ambulating x 25'. Highly recommend that the patient delay DC until tomorrow. Pt. States that her spouse is not able to assist patient, therefore, patient needs to be modified  Independent for toileting. Patient also is to receive Another unit of blood , PT will not be able to work with patient while infusing due to IV site in anticubital space and risk for  Disrupting the infusion. Continue PT. RN aware of discharge barriers for today.   Follow Up Recommendations  Home health PT;Supervision/Assistance - 24 hour     Equipment Recommendations  None recommended by PT    Recommendations for Other Services       Precautions / Restrictions Precautions Precautions: Knee;Fall Precaution Comments: used KI although able to perform SLR Required Braces or Orthoses: Knee Immobilizer - Left Knee Immobilizer - Left: Other (comment) Restrictions LLE Weight Bearing: Weight bearing as tolerated    Mobility  Bed Mobility Overal bed mobility: Needs Assistance Bed Mobility: Supine to Sit;Sit to Supine     Supine to sit: Min assist Sit to supine: Min assist   General bed mobility comments: assist for the  left leg to the bed edge, assist lt leg onto bed.  Transfers Overall transfer level: Needs assistance Equipment used: Rolling walker (2 wheeled) Transfers: Sit to/from [facility] Sit to Stand: Min  guard Stand pivot transfers: Min assist       General transfer comment: patient more alert, seen after 1 unit of blood. Able to transfer to [facility] with min guard and RW.  Ambulation/Gait Ambulation/Gait assistance: Min assist Ambulation Distance (Feet): 25 Feet   Gait Pattern/deviations: Step-to pattern;Step-through pattern;Decreased step length - left;Decreased stance time - left Gait velocity: slow pace, reported to be fatigued after ambulation       Stairs            Wheelchair Mobility    Modified Rankin (Stroke Patients Only)       Balance Overall balance assessment: Needs assistance   Sitting balance-Leahy Scale: Good     Standing balance support: During functional activity;Single extremity supported   Standing balance comment: able to stand and perform self pericare after toileting holding onto RW w/ 1 hand, min guard                            Cognition Arousal/Alertness: Awake/alert                                            Exercises Total Joint Exercises Ankle Circles/Pumps: AROM;Both;10 reps Quad Sets: AROM;Both;10 reps Towel Squeeze: AAROM;Left;Supine Heel Slides: AAROM;Left;10 reps Hip ABduction/ADduction: AAROM;10 reps;Left Straight Leg Raises: AROM;Left;10 reps    General Comments        Pertinent Vitals/Pain Faces Pain Scale: Hurts little more  Pain Location: L knee  Pain Descriptors / Indicators: Discomfort;Sore Pain Intervention(s): Monitored during session;Premedicated before session    Home Living                      Prior Function            PT Goals (current goals can now be found in the care plan section) Progress towards PT goals: Progressing toward goals    Frequency    7X/week      PT Plan Current plan remains appropriate    Co-evaluation             End of Session Equipment Utilized During Treatment: Left knee immobilizer Activity Tolerance: Patient tolerated  treatment well;Patient limited by fatigue Patient left: in bed;with call bell/phone within reach Nurse Communication: Mobility status PT Visit Diagnosis: Muscle weakness (generalized) (M62.81);Other abnormalities of gait and mobility (R26.89);Pain Pain - Right/Left: Left Pain - part of body: Knee     Time: 1224-1300 PT Time Calculation (min) (ACUTE ONLY): 36 min  Charges:  $Gait Training: 8-22 mins $Therapeutic Exercise: 8-22 mins                    G CodesTresa Endo PT [phone]   Claretha Cooper 02/13/2017, 1:35 PM

## 2017-02-13 NOTE — Progress Notes (Signed)
PT Cancellation Note  Patient Details Name: Sandra Abbott MRN: [id number] DOB: August 08, 1941   Cancelled Treatment:    Reason Eval/Treat Not Completed: Medical issues which prohibited therapy (receiving blood.)PT in AM for 1 step instruction, increase ambulation. Plans DC tomorrow after therapy.   Claretha Cooper 02/13/2017, 3:51 PM

## 2017-02-13 NOTE — Care Management Important Message (Signed)
Important Message  Patient Details  Name: Sandra Abbott MRN: [id number] Date of Birth: Apr 11, 1941   Medicare Important Message Given:  Yes    Ibrahima Holberg 02/13/2017, 1:50 PM

## 2017-02-13 NOTE — Progress Notes (Signed)
Patient ID: Sandra Abbott, female   DOB: September 05, 1941, 76 y.o.   MRN: [id number] Tolerating the transfusion thus far and feels a little better.  Will wait to discharge to home tomorrow (3/31).

## 2017-02-13 NOTE — Progress Notes (Signed)
Patient ID: Sandra Abbott, female   DOB: 10-11-1941, 76 y.o.   MRN: [id number] Feels much better overall with less nausea, but does have acute blood loss anemia.  Some of the anemia is from surgery, but also component that is dilutional due to hydration from nausea.  I spoke with her about receiving a transfusion today and she agreed.  Will continue therapy today and I will see her later this afternoon to determine if discharging today vs tomorrow.  Knee is stable.

## 2017-02-13 NOTE — Progress Notes (Signed)
Pt received 2 units PRBC. No signs or symptoms of reaction, and pt tolerated well. Vital signs stable and pt resting comfortably in bed with no complaints of pain. Will continue to monitor

## 2017-02-14 LAB — CBC
HEMATOCRIT: 27.1 % — AB (ref 36.0–46.0)
Hemoglobin: 9.2 g/dL — ABNORMAL LOW (ref 12.0–15.0)
MCH: 29.7 pg (ref 26.0–34.0)
MCHC: 33.9 g/dL (ref 30.0–36.0)
MCV: 87.4 fL (ref 78.0–100.0)
PLATELETS: 196 10*3/uL (ref 150–400)
RBC: 3.1 MIL/uL — ABNORMAL LOW (ref 3.87–5.11)
RDW: 16.1 % — AB (ref 11.5–15.5)
WBC: 7.2 10*3/uL (ref 4.0–10.5)

## 2017-02-14 LAB — TYPE AND SCREEN
ABO/RH(D): O POS
ANTIBODY SCREEN: NEGATIVE
UNIT DIVISION: 0
UNIT DIVISION: 0

## 2017-02-14 LAB — BPAM RBC
BLOOD PRODUCT EXPIRATION DATE: 201804072359
Blood Product Expiration Date: 201804062359
ISSUE DATE / TIME: 201803300955
ISSUE DATE / TIME: 201803301336
UNIT TYPE AND RH: 5100
Unit Type and Rh: 5100

## 2017-02-14 NOTE — Progress Notes (Signed)
Occupational Therapy Treatment Patient Details Name: Sandra Abbott MRN: [id number] DOB: 1941/05/18 Today's Date: 02/14/2017    History of present illness Admitted for [place], [place];  has a past medical history of Arthritis; Coronary artery disease Hypothyroidism; PONV (postoperative nausea and vomiting); and Stroke ([place]).  has a past surgical history that includes Coronary angioplasty with stent;  Back surgery   OT comments  Pt. Moving well with acute OT today.  Reports feeling much better.  Able to complete bed mobility, and toileting tasks.  Reports having family support and assistance as needed once home.    Follow Up Recommendations  SNF;Supervision/Assistance - 24 hour    Equipment Recommendations  3 in 1 bedside commode    Recommendations for Other Services      Precautions / Restrictions Precautions Precautions: Knee;Fall Precaution Comments: no KI for today's session.  pt. reports she has been amb. without it Restrictions LLE Weight Bearing: Weight bearing as tolerated       Mobility Bed Mobility Overal bed mobility: Modified Independent Bed Mobility: Supine to Sit           General bed mobility comments: HOB flat, able to transition into sitting without assist and guide BLEs OOB.  reports she will likely sleep in recliner initially  Transfers Overall transfer level: Needs assistance Equipment used: Rolling walker (2 wheeled) Transfers: Sit to/from [facility] Sit to Stand: Supervision Stand pivot transfers: Supervision       General transfer comment: moving well.  states she is feeling better.  son lives next door and will be helping.  also states she has a great relationship with all of her dtr-n-laws who will also be helping    Balance                                           ADL either performed or assessed with clinical judgement   ADL Overall ADL's : Needs assistance/impaired                          Toilet Transfer: Geophysical data processor Details (indicate cue type and reason): 3n1 over commode, educated on hand placement, reaching back before sitting down Toileting- Clothing Manipulation and Hygiene: Set up;Sit to/from stand;Sitting/lateral lean     Tub/Shower Transfer Details (indicate cue type and reason): pt. declined need for tub/shower transfer.  will be sponge bathing initially Functional mobility during ADLs: Supervision/safety       Vision       Perception     Praxis      Cognition Arousal/Alertness: Awake/alert Behavior During Therapy: WFL for tasks assessed/performed Overall Cognitive Status: Within Functional Limits for tasks assessed                                          Exercises     Shoulder Instructions       General Comments      Pertinent Vitals/ Pain       Pain Assessment: No/denies pain  Home Living  Prior Functioning/Environment              Frequency  Min 2X/week        Progress Toward Goals  OT Goals(current goals can now be found in the care plan section)  Progress towards OT goals: Progressing toward goals  ADL Goals Pt Will Perform Lower Body Bathing: with supervision;sit to/from stand Pt Will Perform Lower Body Dressing: with supervision;sit to/from stand Pt Will Transfer to Toilet: with supervision;bedside commode (BSC over toilet) Pt Will Perform Toileting - Clothing Manipulation and hygiene: with supervision;sit to/from stand Pt Will Perform Tub/Shower Transfer: with supervision;Shower transfer;shower seat;rolling walker  Plan Discharge plan remains appropriate    Co-evaluation                 End of Session Equipment Utilized During Treatment: Gait belt;Rolling walker  OT Visit Diagnosis: Pain;Muscle weakness (generalized) (M62.81) Pain - Right/Left: Left Pain - part of body: Knee    Activity Tolerance Patient tolerated treatment well   Patient Left in chair;with call bell/phone within reach   Nurse Communication Other (comment) (spoke with RN, reported pt. wants to take of her 82, rn states ok)        Time: 5208-0223 OT Time Calculation (min): 15 min  Charges: OT General Charges $OT Visit: 1 Procedure OT Treatments $Self Care/Home Management : 8-22 mins   Janice Coffin, COTA/L 02/14/2017, 8:32 AM

## 2017-02-14 NOTE — Care Management Note (Signed)
76 yo F s/p  LTKA  Received referral to assist with HHPT and DME.  Met with pt at bedside. She plans to return home with the support of her husband and son. She has a RW and a 3-in-1 BSC. Asked pt if she has a preference for a HH agency. She reports that someone already called her about the HH therapy. She doesn't know the name of the agency. Contacted Donna at [facility] and she reports that they received the referral. Pt agrees with agency. 

## 2017-02-14 NOTE — Progress Notes (Signed)
Physical Therapy Treatment Patient Details Name: Sandra Abbott MRN: [id number] DOB: 05-20-1941 Today's Date: 02/14/2017    History of Present Illness Admitted for LTKA, WBAT;  has a past medical history of Arthritis; Coronary artery disease Hypothyroidism; PONV (postoperative nausea and vomiting); and Stroke ([place]).  has a past surgical history that includes Coronary angioplasty with stent;  Back surgery    PT Comments    Pt presents with improved mobility this session. Pt is able to increased gait distance x 75' and perform 1 step negotiation with minimal cues for first attempt and no cues for second attempt. ROM measurements taken and noted below. Pt will benefit from continued services once she returns home.     Follow Up Recommendations  Home health PT;Supervision - Intermittent     Equipment Recommendations  None recommended by PT    Recommendations for Other Services       Precautions / Restrictions Precautions Precautions: Knee;Fall Precaution Booklet Issued: No Precaution Comments: no KI for today's session.  pt. reports she has been amb. without it Restrictions Weight Bearing Restrictions: Yes LLE Weight Bearing: Weight bearing as tolerated    Mobility  Bed Mobility Overal bed mobility: Modified Independent Bed Mobility: Supine to Sit;Sit to Supine           General bed mobility comments: HOB flat, able to transition into sitting without assist and guide BLEs OOB.  reports she will likely sleep in recliner initially  Transfers Overall transfer level: Needs assistance Equipment used: Rolling walker (2 wheeled) Transfers: Sit to/from Stand Sit to Stand: Supervision Stand pivot transfers: Supervision       General transfer comment: supervision for safety.   Ambulation/Gait Ambulation/Gait assistance: Min guard Ambulation Distance (Feet): 75 Feet Assistive device: Rolling walker (2 wheeled) Gait Pattern/deviations: Step-to pattern;Step-through  pattern;Decreased step length - left;Decreased stance time - left Gait velocity: decreased Gait velocity interpretation: Below normal speed for age/gender General Gait Details: min guard for safety with min cues for sequencing. Increased distance noted   Stairs Stairs: Yes   Stair Management: No rails;Step to pattern;Forwards;With walker Number of Stairs: 1 General stair comments: instructed on 1 step to simulate getting into home. Cues for sequencing  Wheelchair Mobility    Modified Rankin (Stroke Patients Only)       Balance                                            Cognition Arousal/Alertness: Awake/alert Behavior During Therapy: WFL for tasks assessed/performed Overall Cognitive Status: Within Functional Limits for tasks assessed                                        Exercises Total Joint Exercises Ankle Circles/Pumps: AROM;Both;10 reps Quad Sets: AROM;Both;10 reps Heel Slides: AAROM;Left;10 reps Straight Leg Raises: Left;10 reps;AROM Goniometric ROM: 1-70    General Comments        Pertinent Vitals/Pain Pain Assessment: 0-10 Pain Score: 3  Pain Location: L knee  Pain Descriptors / Indicators: Discomfort;Sore Pain Intervention(s): Monitored during session;Premedicated before session;Ice applied    Home Living                      Prior Function            PT Goals (  current goals can now be found in the care plan section) Acute Rehab PT Goals Patient Stated Goal: to get better  Progress towards PT goals: Progressing toward goals    Frequency    7X/week      PT Plan Current plan remains appropriate    Co-evaluation             End of Session Equipment Utilized During Treatment: Gait belt Activity Tolerance: Patient tolerated treatment well Patient left: in bed;with call bell/phone within reach Nurse Communication: Mobility status PT Visit Diagnosis: Muscle weakness (generalized)  (M62.81);Other abnormalities of gait and mobility (R26.89);Pain Pain - Right/Left: Left Pain - part of body: Knee     Time: 4585-9292 PT Time Calculation (min) (ACUTE ONLY): 30 min  Charges:  $Gait Training: 8-22 mins $Therapeutic Exercise: 8-22 mins                    G Codes:       Scheryl Marten PT, DPT  [phone]    Jacqulyn Liner Sloan Leiter 02/14/2017, 11:54 AM

## 2017-02-14 NOTE — Progress Notes (Signed)
Patient discharged alert and oriented with family. Belongings at bedside were taken with her and discharge instructions reviewed with patient and her son. Both verbalized understanding.

## 2017-02-14 NOTE — Progress Notes (Signed)
Subjective: Patient stable.  Knee has less pain.   Objective: Vital signs in last 24 hours: Temp:  [97.8 F (36.6 C)-98.3 F (36.8 C)] 98.3 F (36.8 C) (03/31 0456) Pulse Rate:  [61-77] 61 (03/31 0456) Resp:  [14-16] 15 (03/30 1645) BP: (110-135)/(43-55) 135/49 (03/31 0456) SpO2:  [95 %-99 %] 97 % (03/31 0921)  Intake/Output from previous day: 03/30 0701 - 03/31 0700 In: 1607.5 [P.O.:240; Blood:1367.5] Out: -  Intake/Output this shift: Total I/O In: 240 [P.O.:240] Out: -   Exam:  Dorsiflexion/Plantar flexion intact  Labs:  Recent Labs  02/12/17 0759 02/13/17 0454 02/14/17 0321  HGB 8.3* 7.0* 9.2*    Recent Labs  02/13/17 0454 02/14/17 0321  WBC 7.6 7.2  RBC 2.20* 3.10*  HCT 20.4* 27.1*  PLT 142* 196    Recent Labs  02/12/17 0759  NA 136  K 4.1  CL 102  CO2 27  BUN 22*  CREATININE 0.95  GLUCOSE 121*  CALCIUM 7.8*   No results for input(s): LABPT, INR in the last 72 hours.  Assessment/Plan: Plan discharge home today.   Sandra Abbott 02/14/2017, 10:58 AM

## 2017-02-16 DIAGNOSIS — Z7982 Long term (current) use of aspirin: Secondary | ICD-10-CM | POA: Diagnosis not present

## 2017-02-16 DIAGNOSIS — Z87891 Personal history of nicotine dependence: Secondary | ICD-10-CM | POA: Diagnosis not present

## 2017-02-16 DIAGNOSIS — Z7902 Long term (current) use of antithrombotics/antiplatelets: Secondary | ICD-10-CM | POA: Diagnosis not present

## 2017-02-16 DIAGNOSIS — Z955 Presence of coronary angioplasty implant and graft: Secondary | ICD-10-CM | POA: Diagnosis not present

## 2017-02-16 DIAGNOSIS — I2510 Atherosclerotic heart disease of native coronary artery without angina pectoris: Secondary | ICD-10-CM | POA: Diagnosis not present

## 2017-02-16 DIAGNOSIS — Z79891 Long term (current) use of opiate analgesic: Secondary | ICD-10-CM | POA: Diagnosis not present

## 2017-02-16 DIAGNOSIS — Z96652 Presence of left artificial knee joint: Secondary | ICD-10-CM | POA: Diagnosis not present

## 2017-02-16 DIAGNOSIS — Z471 Aftercare following joint replacement surgery: Secondary | ICD-10-CM | POA: Diagnosis not present

## 2017-02-16 DIAGNOSIS — I10 Essential (primary) hypertension: Secondary | ICD-10-CM | POA: Diagnosis not present

## 2017-02-16 DIAGNOSIS — Z8673 Personal history of transient ischemic attack (TIA), and cerebral infarction without residual deficits: Secondary | ICD-10-CM | POA: Diagnosis not present

## 2017-02-19 NOTE — Discharge Summary (Signed)
Patient ID: TENAE GRAZIOSI MRN: [id number] DOB/AGE: 01/07/1941 76 y.o.  Admit date: 02/10/2017 Discharge date: 02/19/2017  Admission Diagnoses:  Principal Problem:   Unilateral primary osteoarthritis, left knee Active Problems:   Status post total left knee replacement   Discharge Diagnoses:  Same  Past Medical History:  Diagnosis Date  . Arthritis    "all over I reckon" (02/11/2017)  . Coronary artery disease   . GERD (gastroesophageal reflux disease)   . High cholesterol   . History of hiatal hernia   . Hypertension   . Hypothyroidism   . PONV (postoperative nausea and vomiting)   . 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 female who was admitted 02/10/2017 for operative treatment ofUnilateral primary osteoarthritis, left knee. Patient has severe unremitting pain that affects sleep, daily activities, and work/hobbies. After pre-op clearance the patient was taken to the operating room on 02/10/2017 and underwent  Procedure(s): LEFT TOTAL KNEE ARTHROPLASTY.    Patient was given perioperative antibiotics:  Anti-infectives    Start     Dose/Rate Route Frequency Ordered Stop   02/10/17 1800  clindamycin (CLEOCIN) IVPB 600 mg     600 mg 100 mL/hr over 30 Minutes Intravenous Every 6 hours 02/10/17 1756 02/11/17 0119   02/10/17 1009  clindamycin (CLEOCIN) 900 MG/50ML IVPB    Comments:  Forte, Lindsi   : cabinet override      02/10/17 1009 02/10/17 1215       Patient was given sequential compression devices, early ambulation, and chemoprophylaxis to prevent DVT.  Patient benefited maximally from hospital stay and there were no complications.    Recent vital signs: No data found.    Recent laboratory studies: No results for input(s): WBC, HGB, HCT, PLT, NA, K, CL, CO2, BUN, CREATININE, GLUCOSE, INR, CALCIUM in  the last 72 hours.  Invalid input(s): PT, 2   Discharge Medications:   Allergies as of 02/14/2017      Reactions   Cefdinir Itching, Rash, Other (See Comments)   CHEST PAIN   Mirabegron Swelling   LIPS, REQUIRED TRIP TO ED   Penicillins Anaphylaxis, Hives, Swelling   Tongue swells and has chest pains Has patient had a PCN reaction causing immediate rash, facial/tongue/throat swelling, SOB or lightheadedness with hypotension: Yes Has patient had a PCN reaction causing severe rash involving mucus membranes or skin necrosis: Yes Has patient had a PCN reaction that required hospitalization No Has patient had a PCN reaction occurring within the last 10 years: Yes If all of the above answers are "NO", then may proceed with Cephalosporin use.   Sulfa Antibiotics Anaphylaxis, Hives, Swelling   TONGUE CHEST PAIN   Sulfamethoxazole-trimethoprim Hives, Swelling   CHEST PAIN   Codeine Nausea And Vomiting, Nausea Only   Lisinopril Diarrhea, Cough   Valproic Acid Nausea Only      Medication List    TAKE these medications   acetaminophen 325 MG tablet Commonly known as:  TYLENOL Take 650 mg by mouth every 6 (six) hours as needed for mild pain or moderate pain.   aspirin 81 MG tablet Take 81 mg by mouth at bedtime.   atorvastatin 10 MG tablet Commonly known as:  LIPITOR Take 10 mg by mouth at bedtime.   Calcium/Vitamin D 600-400 MG-UNIT Tabs Take 1 tablet by mouth daily.   cetirizine 10  MG tablet Commonly known as:  ZYRTEC Take 10 mg by mouth daily.   clopidogrel 75 MG tablet Commonly known as:  PLAVIX TAKE 1 TABLET BY MOUTH EVERY DAY   Coenzyme Q10 300 MG Caps Take 300 mg by mouth daily.   ezetimibe 10 MG tablet Commonly known as:  ZETIA Take 10 mg by mouth at bedtime.   Fish Oil 1000 MG Caps Take 1,000 mg by mouth daily.   fluticasone 50 MCG/ACT nasal spray Commonly known as:  FLONASE Place 1 spray into both nostrils daily.   HYDROcodone-acetaminophen 5-325 MG  tablet Commonly known as:  NORCO/VICODIN Take 1-2 tablets by mouth every 4 (four) hours as needed. What changed:  how much to take   levothyroxine 75 MCG tablet Commonly known as:  SYNTHROID, LEVOTHROID Take 75 mcg by mouth daily before breakfast.   losartan 100 MG tablet Commonly known as:  COZAAR Take 100 mg by mouth daily.   Magnesium Oxide 250 MG Tabs Take 250 mg by mouth at bedtime.   metoprolol tartrate 25 MG tablet Commonly known as:  LOPRESSOR Take 25 mg by mouth 2 (two) times daily.   omeprazole 40 MG capsule Commonly known as:  PRILOSEC Take 40 mg by mouth daily.   promethazine 12.5 MG tablet Commonly known as:  PHENERGAN Take 1 tablet (12.5 mg total) by mouth every 6 (six) hours as needed for nausea or vomiting.   vitamin B-12 1000 MCG tablet Commonly known as:  CYANOCOBALAMIN Take 1,000 mcg by mouth daily.       Diagnostic Studies: Dg Knee Left Port  Result Date: 02/10/2017 CLINICAL DATA:  Post left knee replacement. EXAM: PORTABLE LEFT KNEE - 1-2 VIEW COMPARISON:  MRI 12/30/2016 FINDINGS: Changes of left knee replacement. No hardware or bony complicating feature. Soft tissue and joint space gas noted. IMPRESSION: Left knee replacement.  No complicating feature. Electronically Signed   By: Rolm Baptise M.D.   On: 02/10/2017 15:23    Disposition: 01-Home or Self Care  Discharge Instructions    Call MD / Call 911    Complete by:  As directed    If you experience chest pain or shortness of breath, CALL 911 and be transported to the hospital emergency room.  If you develope a fever above 101 F, pus (white drainage) or increased drainage or redness at the wound, or calf pain, call your surgeon's office.   Constipation Prevention    Complete by:  As directed    Drink plenty of fluids.  Prune juice may be helpful.  You may use a stool softener, such as Colace (over the counter) 100 mg twice a day.  Use MiraLax (over the counter) for constipation as needed.   Diet  - low sodium heart healthy    Complete by:  As directed    Increase activity slowly as tolerated    Complete by:  As directed       Follow-up Information    Mcarthur Rossetti, MD Follow up in 2 week(s).   Specialty:  Orthopedic Surgery Contact information: [place] [address] [phone]        [facility] Follow up.   Specialty:  [place] Why:  [facility] will provide home health physical therapy Contact information: [place] [address] [phone]            Signed: Mcarthur Rossetti 02/19/2017, 9:50 PM

## 2017-02-23 DIAGNOSIS — Z471 Aftercare following joint replacement surgery: Secondary | ICD-10-CM | POA: Diagnosis not present

## 2017-02-23 DIAGNOSIS — Z96652 Presence of left artificial knee joint: Secondary | ICD-10-CM | POA: Diagnosis not present

## 2017-02-23 DIAGNOSIS — Z955 Presence of coronary angioplasty implant and graft: Secondary | ICD-10-CM | POA: Diagnosis not present

## 2017-02-23 DIAGNOSIS — Z87891 Personal history of nicotine dependence: Secondary | ICD-10-CM | POA: Diagnosis not present

## 2017-02-23 DIAGNOSIS — Z79891 Long term (current) use of opiate analgesic: Secondary | ICD-10-CM | POA: Diagnosis not present

## 2017-02-23 DIAGNOSIS — Z7902 Long term (current) use of antithrombotics/antiplatelets: Secondary | ICD-10-CM | POA: Diagnosis not present

## 2017-02-23 DIAGNOSIS — Z7982 Long term (current) use of aspirin: Secondary | ICD-10-CM | POA: Diagnosis not present

## 2017-02-23 DIAGNOSIS — Z8673 Personal history of transient ischemic attack (TIA), and cerebral infarction without residual deficits: Secondary | ICD-10-CM | POA: Diagnosis not present

## 2017-02-23 DIAGNOSIS — I2510 Atherosclerotic heart disease of native coronary artery without angina pectoris: Secondary | ICD-10-CM | POA: Diagnosis not present

## 2017-02-23 DIAGNOSIS — I10 Essential (primary) hypertension: Secondary | ICD-10-CM | POA: Diagnosis not present

## 2017-02-24 ENCOUNTER — Ambulatory Visit (INDEPENDENT_AMBULATORY_CARE_PROVIDER_SITE_OTHER): Payer: PPO | Admitting: Physician Assistant

## 2017-02-24 DIAGNOSIS — Z96652 Presence of left artificial knee joint: Secondary | ICD-10-CM

## 2017-02-24 NOTE — Progress Notes (Signed)
Mrs. Templer 76 year old female status post left total knee arthroplasty 2 weeks. She's overall doing well. She has made good progress with physical therapy. She denies any fevers chills shortness breath chest pain.   Physical exam left knee she has full extension flexion to approximately 100. No instability valgus varus stressing. Left calf supple nontender. Surgical incisions healing well no signs of infection  Assessment/ Plan: She'll transition to outpatient physical therapy prescriptions given for [facility] to work on range of motion strengthening modalities and home exercise program.  She is able to get the incision wet.  See her back in 1 month sooner if there is any questions or concerns.

## 2017-02-27 DIAGNOSIS — M25562 Pain in left knee: Secondary | ICD-10-CM | POA: Diagnosis not present

## 2017-02-27 DIAGNOSIS — M1712 Unilateral primary osteoarthritis, left knee: Secondary | ICD-10-CM | POA: Diagnosis not present

## 2017-03-02 DIAGNOSIS — E7800 Pure hypercholesterolemia, unspecified: Secondary | ICD-10-CM | POA: Diagnosis not present

## 2017-03-03 DIAGNOSIS — M25562 Pain in left knee: Secondary | ICD-10-CM | POA: Diagnosis not present

## 2017-03-03 DIAGNOSIS — M1712 Unilateral primary osteoarthritis, left knee: Secondary | ICD-10-CM | POA: Diagnosis not present

## 2017-03-05 DIAGNOSIS — M1712 Unilateral primary osteoarthritis, left knee: Secondary | ICD-10-CM | POA: Diagnosis not present

## 2017-03-05 DIAGNOSIS — M25562 Pain in left knee: Secondary | ICD-10-CM | POA: Diagnosis not present

## 2017-03-09 DIAGNOSIS — M25562 Pain in left knee: Secondary | ICD-10-CM | POA: Diagnosis not present

## 2017-03-09 DIAGNOSIS — M1712 Unilateral primary osteoarthritis, left knee: Secondary | ICD-10-CM | POA: Diagnosis not present

## 2017-03-10 DIAGNOSIS — E7800 Pure hypercholesterolemia, unspecified: Secondary | ICD-10-CM | POA: Diagnosis not present

## 2017-03-10 DIAGNOSIS — D649 Anemia, unspecified: Secondary | ICD-10-CM | POA: Diagnosis not present

## 2017-03-10 DIAGNOSIS — I10 Essential (primary) hypertension: Secondary | ICD-10-CM | POA: Diagnosis not present

## 2017-03-12 DIAGNOSIS — M25562 Pain in left knee: Secondary | ICD-10-CM | POA: Diagnosis not present

## 2017-03-12 DIAGNOSIS — M1712 Unilateral primary osteoarthritis, left knee: Secondary | ICD-10-CM | POA: Diagnosis not present

## 2017-03-16 DIAGNOSIS — M1712 Unilateral primary osteoarthritis, left knee: Secondary | ICD-10-CM | POA: Diagnosis not present

## 2017-03-16 DIAGNOSIS — M25562 Pain in left knee: Secondary | ICD-10-CM | POA: Diagnosis not present

## 2017-03-18 DIAGNOSIS — N3000 Acute cystitis without hematuria: Secondary | ICD-10-CM | POA: Diagnosis not present

## 2017-03-18 DIAGNOSIS — R300 Dysuria: Secondary | ICD-10-CM | POA: Diagnosis not present

## 2017-03-19 DIAGNOSIS — M1712 Unilateral primary osteoarthritis, left knee: Secondary | ICD-10-CM | POA: Diagnosis not present

## 2017-03-19 DIAGNOSIS — M25562 Pain in left knee: Secondary | ICD-10-CM | POA: Diagnosis not present

## 2017-03-24 DIAGNOSIS — M1712 Unilateral primary osteoarthritis, left knee: Secondary | ICD-10-CM | POA: Diagnosis not present

## 2017-03-24 DIAGNOSIS — M25562 Pain in left knee: Secondary | ICD-10-CM | POA: Diagnosis not present

## 2017-03-26 ENCOUNTER — Ambulatory Visit (INDEPENDENT_AMBULATORY_CARE_PROVIDER_SITE_OTHER): Payer: PPO | Admitting: Orthopaedic Surgery

## 2017-03-26 DIAGNOSIS — Z96652 Presence of left artificial knee joint: Secondary | ICD-10-CM

## 2017-03-26 NOTE — Progress Notes (Signed)
The patient is now 6 weeks status post a left total knee replacement. She is doing well. She says only thing that is so pronounced and swelling. She's having some trouble coming up and down stairs but otherwise she is making good progress for 76 years old. New peripheral examination he has full extension to 120 of flexion. Other than some mild swelling is ligamentously stable sees be doing well.  At this point she'll continue increase her activities as she tolerates. I'll need to really see her back for 6 months. At that visit I would like an AP and lateral of her left knee.

## 2017-03-27 DIAGNOSIS — M25562 Pain in left knee: Secondary | ICD-10-CM | POA: Diagnosis not present

## 2017-03-27 DIAGNOSIS — M1712 Unilateral primary osteoarthritis, left knee: Secondary | ICD-10-CM | POA: Diagnosis not present

## 2017-03-30 DIAGNOSIS — L570 Actinic keratosis: Secondary | ICD-10-CM | POA: Diagnosis not present

## 2017-03-30 DIAGNOSIS — Z85828 Personal history of other malignant neoplasm of skin: Secondary | ICD-10-CM | POA: Diagnosis not present

## 2017-03-30 DIAGNOSIS — Z08 Encounter for follow-up examination after completed treatment for malignant neoplasm: Secondary | ICD-10-CM | POA: Diagnosis not present

## 2017-03-30 DIAGNOSIS — M1712 Unilateral primary osteoarthritis, left knee: Secondary | ICD-10-CM | POA: Diagnosis not present

## 2017-03-30 DIAGNOSIS — M25562 Pain in left knee: Secondary | ICD-10-CM | POA: Diagnosis not present

## 2017-04-03 DIAGNOSIS — M25562 Pain in left knee: Secondary | ICD-10-CM | POA: Diagnosis not present

## 2017-04-03 DIAGNOSIS — M1712 Unilateral primary osteoarthritis, left knee: Secondary | ICD-10-CM | POA: Diagnosis not present

## 2017-04-06 DIAGNOSIS — M25562 Pain in left knee: Secondary | ICD-10-CM | POA: Diagnosis not present

## 2017-04-06 DIAGNOSIS — M1712 Unilateral primary osteoarthritis, left knee: Secondary | ICD-10-CM | POA: Diagnosis not present

## 2017-04-16 DIAGNOSIS — N3000 Acute cystitis without hematuria: Secondary | ICD-10-CM | POA: Diagnosis not present

## 2017-04-17 NOTE — Addendum Note (Signed)
Addendum  created 04/17/17 1259 by Rica Koyanagi, MD   Sign clinical note

## 2017-06-01 ENCOUNTER — Telehealth (INDEPENDENT_AMBULATORY_CARE_PROVIDER_SITE_OTHER): Payer: Self-pay | Admitting: Orthopaedic Surgery

## 2017-06-01 ENCOUNTER — Telehealth (INDEPENDENT_AMBULATORY_CARE_PROVIDER_SITE_OTHER): Payer: Self-pay

## 2017-06-01 NOTE — Telephone Encounter (Signed)
duplicate

## 2017-06-01 NOTE — Telephone Encounter (Signed)
Please advise 

## 2017-06-01 NOTE — Telephone Encounter (Signed)
Patient called advised she is going to the dentist next Tuesday and need to know if she needs an antibiotic. The number to contact patient is [phone]

## 2017-06-01 NOTE — Telephone Encounter (Signed)
Since it is over 3 months since her surgery, she does not need an antibiotic from my standpoint.

## 2017-06-01 NOTE — Telephone Encounter (Signed)
Patient would like to know if she needs to pre-med before going to her dentist  appt.on next Tuesday.  Please advise.  Cb# is [phone].  Thank You.

## 2017-06-02 NOTE — Telephone Encounter (Signed)
I called patient and advised. 

## 2017-07-03 DIAGNOSIS — Z96652 Presence of left artificial knee joint: Secondary | ICD-10-CM | POA: Diagnosis not present

## 2017-07-03 DIAGNOSIS — M19012 Primary osteoarthritis, left shoulder: Secondary | ICD-10-CM | POA: Diagnosis not present

## 2017-07-16 DIAGNOSIS — E7800 Pure hypercholesterolemia, unspecified: Secondary | ICD-10-CM | POA: Diagnosis not present

## 2017-07-22 DIAGNOSIS — I10 Essential (primary) hypertension: Secondary | ICD-10-CM | POA: Diagnosis not present

## 2017-07-22 DIAGNOSIS — K219 Gastro-esophageal reflux disease without esophagitis: Secondary | ICD-10-CM | POA: Diagnosis not present

## 2017-07-22 DIAGNOSIS — E7800 Pure hypercholesterolemia, unspecified: Secondary | ICD-10-CM | POA: Diagnosis not present

## 2017-07-22 DIAGNOSIS — E039 Hypothyroidism, unspecified: Secondary | ICD-10-CM | POA: Diagnosis not present

## 2017-08-17 DIAGNOSIS — M19012 Primary osteoarthritis, left shoulder: Secondary | ICD-10-CM | POA: Diagnosis not present

## 2017-08-17 DIAGNOSIS — Z01818 Encounter for other preprocedural examination: Secondary | ICD-10-CM | POA: Diagnosis not present

## 2017-08-20 DIAGNOSIS — Z01818 Encounter for other preprocedural examination: Secondary | ICD-10-CM | POA: Diagnosis not present

## 2017-08-20 DIAGNOSIS — M19012 Primary osteoarthritis, left shoulder: Secondary | ICD-10-CM | POA: Diagnosis not present

## 2017-08-20 DIAGNOSIS — Z8673 Personal history of transient ischemic attack (TIA), and cerebral infarction without residual deficits: Secondary | ICD-10-CM | POA: Diagnosis not present

## 2017-08-20 DIAGNOSIS — Z01812 Encounter for preprocedural laboratory examination: Secondary | ICD-10-CM | POA: Diagnosis not present

## 2017-08-20 DIAGNOSIS — Z79899 Other long term (current) drug therapy: Secondary | ICD-10-CM | POA: Diagnosis not present

## 2017-08-20 DIAGNOSIS — Z01810 Encounter for preprocedural cardiovascular examination: Secondary | ICD-10-CM | POA: Diagnosis not present

## 2017-08-20 DIAGNOSIS — Z7902 Long term (current) use of antithrombotics/antiplatelets: Secondary | ICD-10-CM | POA: Diagnosis not present

## 2017-08-24 DIAGNOSIS — M19012 Primary osteoarthritis, left shoulder: Secondary | ICD-10-CM | POA: Diagnosis not present

## 2017-09-02 DIAGNOSIS — Z886 Allergy status to analgesic agent status: Secondary | ICD-10-CM | POA: Diagnosis not present

## 2017-09-02 DIAGNOSIS — Z7982 Long term (current) use of aspirin: Secondary | ICD-10-CM | POA: Diagnosis not present

## 2017-09-02 DIAGNOSIS — Z9049 Acquired absence of other specified parts of digestive tract: Secondary | ICD-10-CM | POA: Diagnosis not present

## 2017-09-02 DIAGNOSIS — M19012 Primary osteoarthritis, left shoulder: Secondary | ICD-10-CM | POA: Diagnosis not present

## 2017-09-02 DIAGNOSIS — Z882 Allergy status to sulfonamides status: Secondary | ICD-10-CM | POA: Diagnosis not present

## 2017-09-02 DIAGNOSIS — M75112 Incomplete rotator cuff tear or rupture of left shoulder, not specified as traumatic: Secondary | ICD-10-CM | POA: Diagnosis not present

## 2017-09-02 DIAGNOSIS — G8918 Other acute postprocedural pain: Secondary | ICD-10-CM | POA: Diagnosis not present

## 2017-09-02 DIAGNOSIS — E785 Hyperlipidemia, unspecified: Secondary | ICD-10-CM | POA: Diagnosis not present

## 2017-09-02 DIAGNOSIS — Z96652 Presence of left artificial knee joint: Secondary | ICD-10-CM | POA: Diagnosis not present

## 2017-09-02 DIAGNOSIS — Z79891 Long term (current) use of opiate analgesic: Secondary | ICD-10-CM | POA: Diagnosis not present

## 2017-09-02 DIAGNOSIS — Z888 Allergy status to other drugs, medicaments and biological substances status: Secondary | ICD-10-CM | POA: Diagnosis not present

## 2017-09-02 DIAGNOSIS — K219 Gastro-esophageal reflux disease without esophagitis: Secondary | ICD-10-CM | POA: Diagnosis not present

## 2017-09-02 DIAGNOSIS — I10 Essential (primary) hypertension: Secondary | ICD-10-CM | POA: Diagnosis not present

## 2017-09-02 DIAGNOSIS — Z79899 Other long term (current) drug therapy: Secondary | ICD-10-CM | POA: Diagnosis not present

## 2017-09-08 DIAGNOSIS — M25512 Pain in left shoulder: Secondary | ICD-10-CM | POA: Diagnosis not present

## 2017-09-11 DIAGNOSIS — M25512 Pain in left shoulder: Secondary | ICD-10-CM | POA: Diagnosis not present

## 2017-09-14 DIAGNOSIS — M25512 Pain in left shoulder: Secondary | ICD-10-CM | POA: Diagnosis not present

## 2017-09-15 DIAGNOSIS — R52 Pain, unspecified: Secondary | ICD-10-CM | POA: Diagnosis not present

## 2017-09-18 DIAGNOSIS — M25512 Pain in left shoulder: Secondary | ICD-10-CM | POA: Diagnosis not present

## 2017-09-21 DIAGNOSIS — M25512 Pain in left shoulder: Secondary | ICD-10-CM | POA: Diagnosis not present

## 2017-09-24 ENCOUNTER — Ambulatory Visit (INDEPENDENT_AMBULATORY_CARE_PROVIDER_SITE_OTHER): Payer: PPO

## 2017-09-24 ENCOUNTER — Encounter (INDEPENDENT_AMBULATORY_CARE_PROVIDER_SITE_OTHER): Payer: Self-pay | Admitting: Physician Assistant

## 2017-09-24 ENCOUNTER — Ambulatory Visit (INDEPENDENT_AMBULATORY_CARE_PROVIDER_SITE_OTHER): Payer: PPO | Admitting: Physician Assistant

## 2017-09-24 DIAGNOSIS — Z96652 Presence of left artificial knee joint: Secondary | ICD-10-CM

## 2017-09-24 DIAGNOSIS — M25512 Pain in left shoulder: Secondary | ICD-10-CM | POA: Diagnosis not present

## 2017-09-24 NOTE — Progress Notes (Signed)
Office Visit Note   Patient: Sandra Abbott           Date of Birth: 09-27-1941           MRN: [id number] Visit Date: 09/24/2017              Requested by: Derrill Center., MD [address] [place], [place] [address] PCP: Derrill Center., MD   Assessment & Plan: Visit Diagnoses:  1. History of left knee replacement     Plan: She will follow-up with [place] at 1 year postop no radiographs at that time unless indicated due to the fact the films were obtained today.  Continue to work on quad strengthening range of motion.  Follow-up sooner if there is any questions or concerns  Follow-Up Instructions: Return in about 4 months (around 01/22/2018).   Orders:  Orders Placed This Encounter  Procedures  . XR Knee 1-2 Views Left   No orders of the defined types were placed in this encounter.     Procedures: No procedures performed   Clinical Data: No additional findings.   Subjective: Chief Complaint  Patient presents with  . Left Knee - Follow-up    HPI Mrs. Heath Lark returns today 7 months status post left total knee arthroplasty.  She states that overall the left knee is doing well.  She is very pleased with the results.  She is having no pain whatsoever.  She does have some numbness about the incision site. Review of Systems No fevers chills shortness of breath chest pain  Objective: Vital Signs: There were no vitals taken for this visit.  Physical Exam  Constitutional: She is oriented to person, place, and time. She appears well-developed and well-nourished. No distress.  Pulmonary/Chest: Effort normal.  Neurological: She is alert and oriented to person, place, and time.  Skin: She is not diaphoretic.  Psychiatric: She has a normal mood and affect.    Ortho Exam Left knee full extension full flexion.  No instability valgus varus stressing.  Surgical incisions well-healed.  Calf supple nontender.  No effusion abnormal warmth or erythema Specialty Comments:  No  specialty comments available.  Imaging: Xr Knee 1-2 Views Left  Result Date: 09/24/2017 Left knee 2 views: No acute fracture.  Well-seated total knee arthroplasty components without any complications.    PMFS History: Patient Active Problem List   Diagnosis Date Noted  . Status post total left knee replacement 02/10/2017  . Unilateral primary osteoarthritis, left knee 01/05/2017   Past Medical History:  Diagnosis Date  . Arthritis    "all over I reckon" (02/11/2017)  . Coronary artery disease   . GERD (gastroesophageal reflux disease)   . High cholesterol   . History of hiatal hernia   . Hypertension   . Hypothyroidism   . PONV (postoperative nausea and vomiting)   . 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  Past Surgical History:  Procedure Laterality Date  . APPENDECTOMY  1964  . BACK SURGERY    . BLADDER SUSPENSION  2011  . CATARACT EXTRACTION, BILATERAL Bilateral 03/2016-04/2016  . CHOLECYSTECTOMY OPEN  1964  . CORONARY ANGIOPLASTY WITH STENT PLACEMENT  06/2010  . DILATION AND CURETTAGE OF UTERUS    . JOINT REPLACEMENT    . [place] SURGERY  2011  . NASAL SEPTUM SURGERY  after 1991  . RECTOCELE REPAIR  2011   "I had 3 of these hernias repaired"  .  TONSILLECTOMY  1946  . TOTAL KNEE ARTHROPLASTY Left 02/10/2017  . VAGINAL HYSTERECTOMY  1986   Social History   Occupational History  . Not on file  Tobacco Use  . Smoking status: Former Smoker    Packs/day: 0.50    Types: Cigarettes    Last attempt to quit: 05/06/1988    Years since quitting: 29.4  . Smokeless tobacco: Never Used  Substance and Sexual Activity  . Alcohol use: No  . Drug use: No  . Sexual activity: Not on file

## 2017-09-26 IMAGING — MR MR KNEE*L* W/O CM
5 of 6 series · 32 of 40 positions shown · non-contrast
Comparison: None.

ADDENDUM:
Not mentioned above is a 13 mm lobulated peripherally T2
hyperintense and centrally T2 hypointense bone lesion eccentrically
located in the distal femoral diametaphysis along the medial aspect
most consistent with an enchondroma without endosteal scalloping or
cortical destruction.
CLINICAL DATA: Left knee pain for 1 year.  No known injury.

EXAM:
MRI OF THE LEFT KNEE WITHOUT CONTRAST
TECHNIQUE: Multiplanar, multisequence MR imaging of the knee was performed. No
intravenous contrast was administered.

[Series 6: PD fat-sat · axial · left · 3.0mm · 0.39mm/px · z∈[-46,+69]mm · 8 of 36 slices shown (1 of 3)]
[im 1/36]
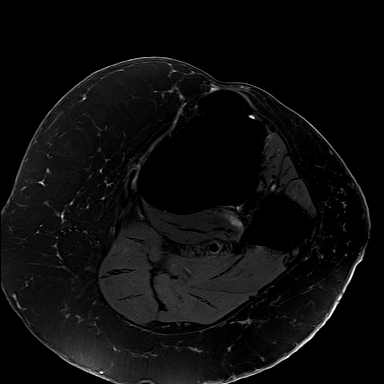
[im 6/36]
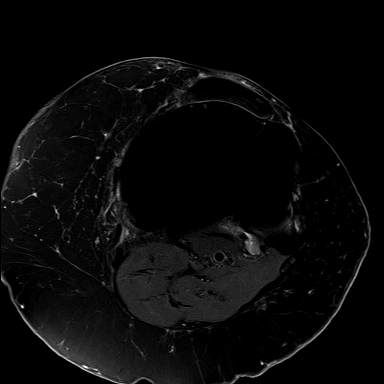
[im 11/36]
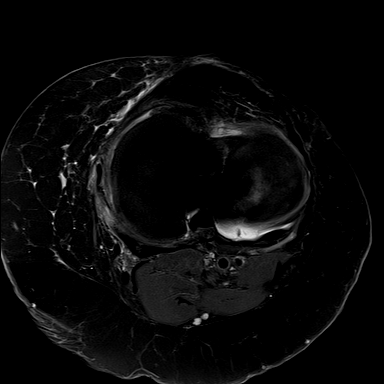
[im 16/36]
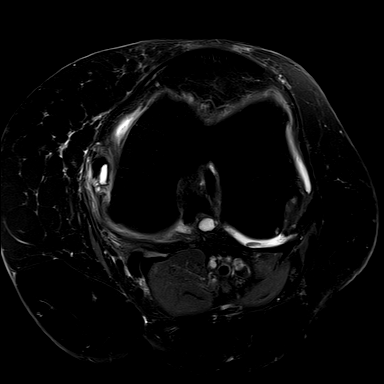
[im 21/36]
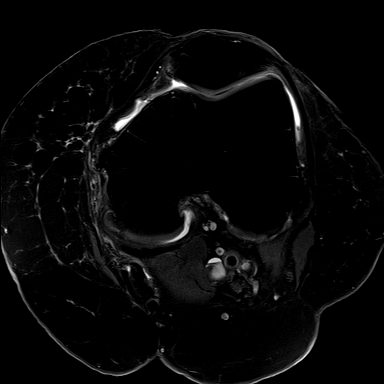
[im 26/36]
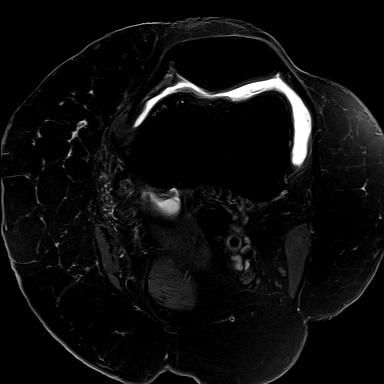
[im 31/36]
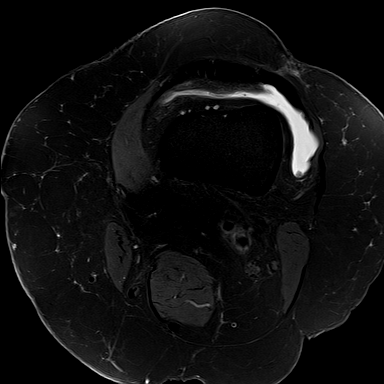
[im 36/36]
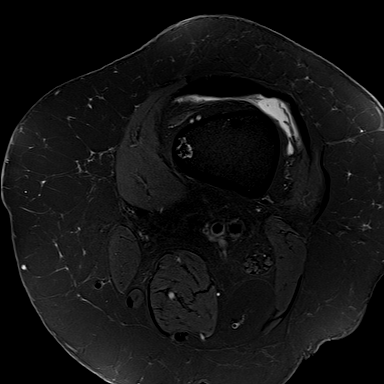

[Series 8: PD fat-sat · coronal · left · 3.0mm · 0.33mm/px · 7 of 33 slices shown (2 of 3)]
[im 1/33]
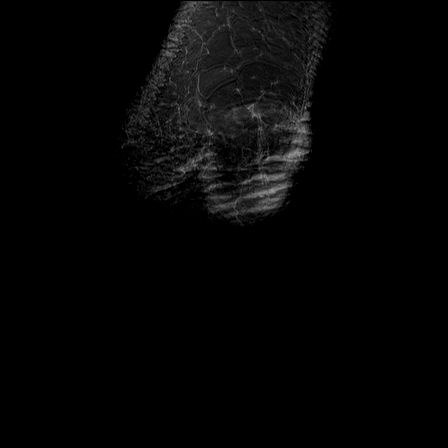
[im 6/33]
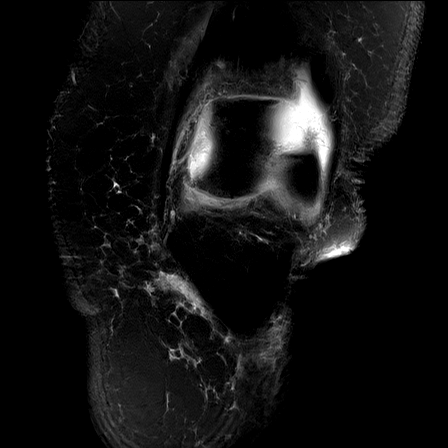
[im 11/33]
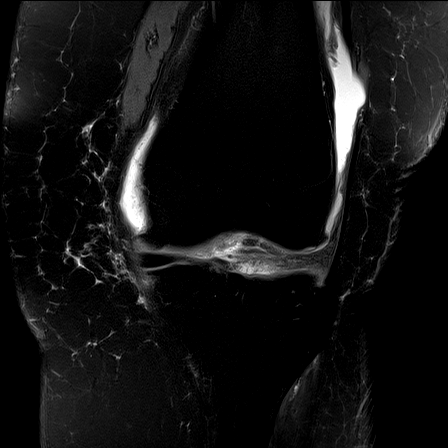
[im 17/33]
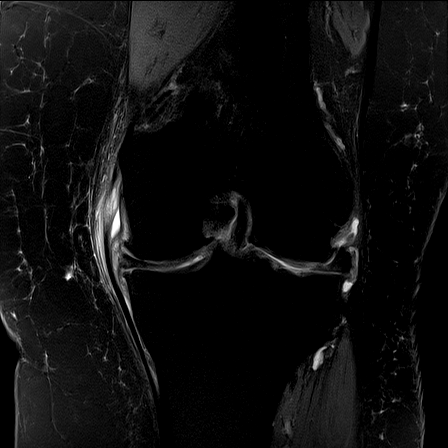
[im 22/33]
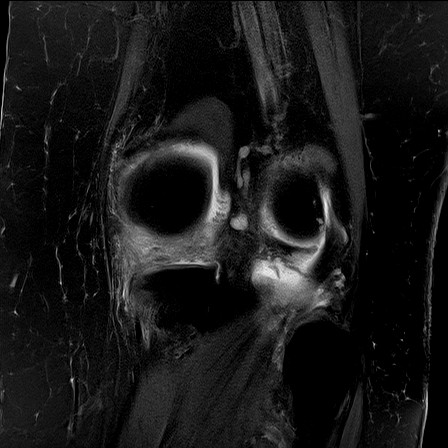
[im 27/33]
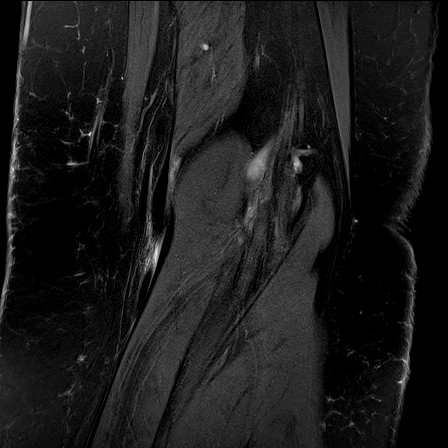
[im 33/33]
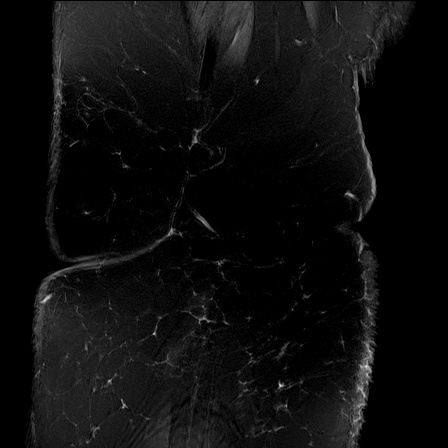

[Series 9: PD fat-sat · sagittal · left · 3.0mm · 0.39mm/px · 6 of 27 slices shown (3 of 3)]
[im 1/27]
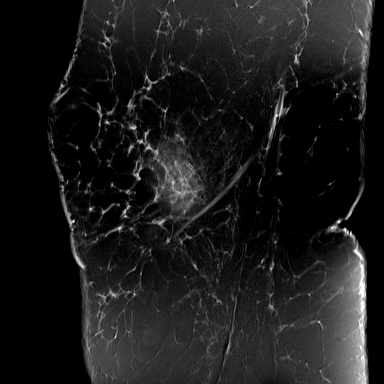
[im 6/27]
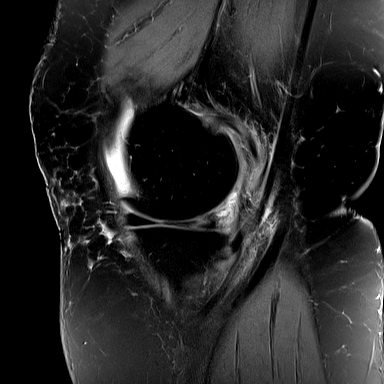
[im 11/27]
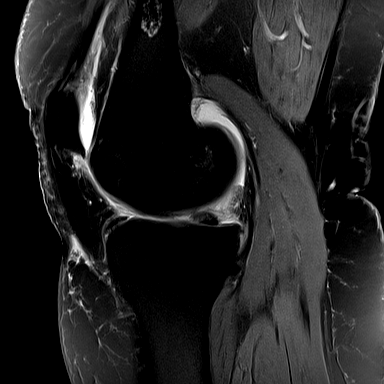
[im 16/27]
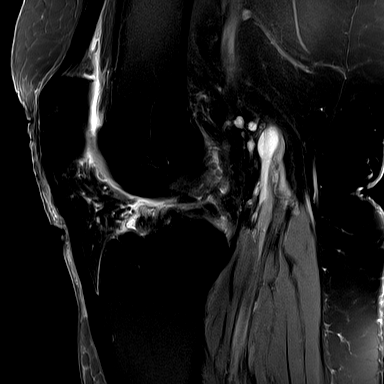
[im 21/27]
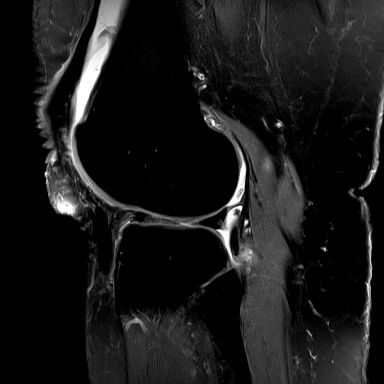
[im 27/27]
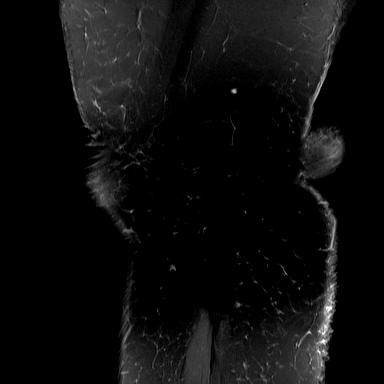

[Series 10: T2 fat-sat · coronal · left · 3.0mm · 0.39mm/px · 6 of 33 slices shown]
[im 1/33]
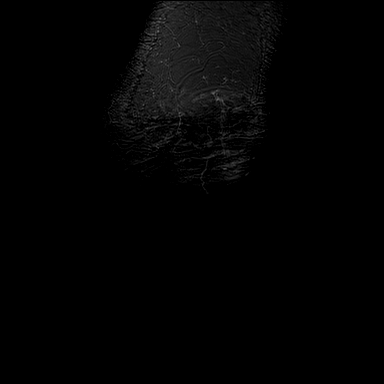
[im 6/33]
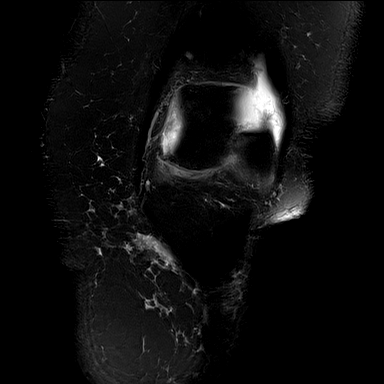
[im 11/33]
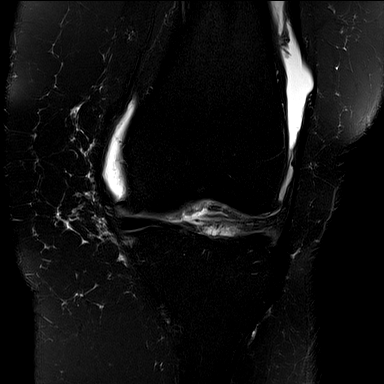
[im 17/33]
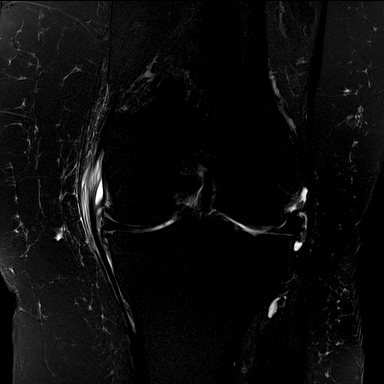
[im 22/33]
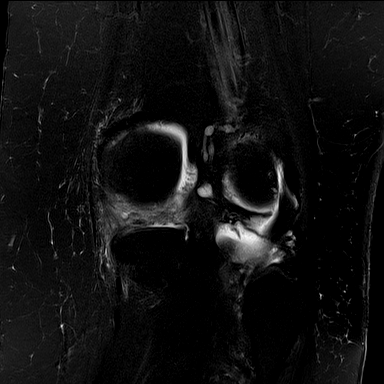
[im 27/33]
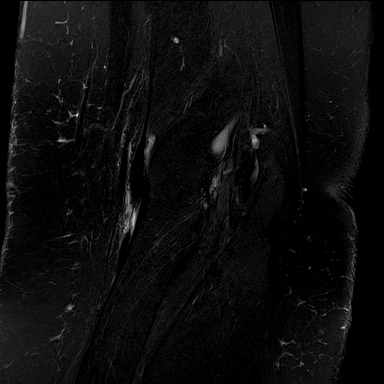

[Series 11: PD · coronal · left · 1.5mm · 0.44mm/px · 5 of 21 slices shown]
[im 1/21]
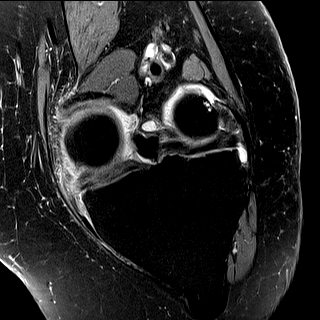
[im 6/21]
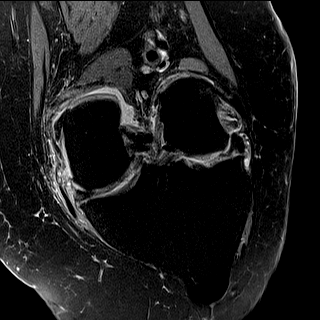
[im 11/21]
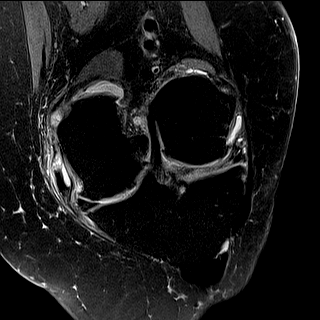
[im 16/21]
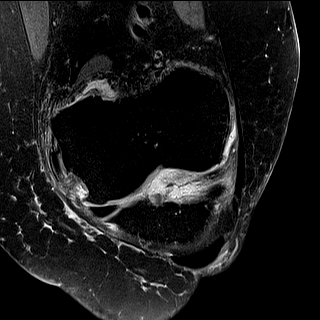
[im 21/21]
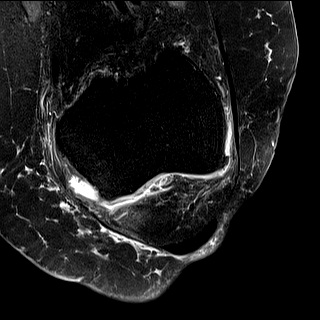

[32 of 40 positions shown; findings below may reference images not displayed]

FINDINGS: MENISCI

Medial meniscus: Severe complex tear of the posterior horn- body
junction of the medial meniscus with peripheral meniscal extrusion.

Lateral meniscus: Complex tear of the anterior horn- body junction
of the lateral meniscus.

LIGAMENTS

Cruciates:  Intact ACL and PCL.

Collaterals: Mild thickening of the proximal medial collateral
ligament concerning for mild strain without a tear. Intact lateral
collateral ligament complex.

CARTILAGE

Patellofemoral: High-grade partial-thickness cartilage loss of the
medial patellofemoral compartment. Partial-thickness cartilage loss
of the lateral patellofemoral compartment.

Medial: High-grade partial-thickness versus full-thickness cartilage
loss of the medial femoral condyle and medial tibial plateau with
marginal osteophytosis.

Lateral: Partial-thickness cartilage loss of the lateral femoral
condyle and lateral tibial plateau with small areas of high-grade
partial-thickness cartilage loss of the lateral tibial plateau.

Joint: Small joint effusion. Mild edema in Hoffa's fat. No plical
thickening.

Popliteal Fossa: Tiny Baker cyst. Moderate -severe insertional
tendinosis of the popliteus tendon.

Extensor Mechanism: Intact quadriceps tendon and patellar tendon.
Intact medial and lateral patellar retinaculum. Intact MPFL.

Bones: No focal marrow signal abnormality. No fracture or
dislocation.

Other: No fluid collection or hematoma.
IMPRESSION: 1. Severe complex tear of the posterior horn- body junction of the
medial meniscus with peripheral meniscal extrusion.
2. Complex tear of the anterior horn- body junction of the lateral
meniscus.
3. Tricompartmental cartilage abnormalities as described above.
4. Moderate -severe insertional tendinosis of the popliteus tendon.

## 2017-09-28 DIAGNOSIS — M25512 Pain in left shoulder: Secondary | ICD-10-CM | POA: Diagnosis not present

## 2017-10-01 DIAGNOSIS — M25512 Pain in left shoulder: Secondary | ICD-10-CM | POA: Diagnosis not present

## 2017-10-05 DIAGNOSIS — M25512 Pain in left shoulder: Secondary | ICD-10-CM | POA: Diagnosis not present

## 2017-10-07 DIAGNOSIS — M25512 Pain in left shoulder: Secondary | ICD-10-CM | POA: Diagnosis not present

## 2017-10-12 DIAGNOSIS — M25512 Pain in left shoulder: Secondary | ICD-10-CM | POA: Diagnosis not present

## 2017-10-14 DIAGNOSIS — Z85828 Personal history of other malignant neoplasm of skin: Secondary | ICD-10-CM | POA: Diagnosis not present

## 2017-10-14 DIAGNOSIS — L570 Actinic keratosis: Secondary | ICD-10-CM | POA: Diagnosis not present

## 2017-10-14 DIAGNOSIS — L820 Inflamed seborrheic keratosis: Secondary | ICD-10-CM | POA: Diagnosis not present

## 2017-10-14 DIAGNOSIS — Z08 Encounter for follow-up examination after completed treatment for malignant neoplasm: Secondary | ICD-10-CM | POA: Diagnosis not present

## 2017-10-15 DIAGNOSIS — M25512 Pain in left shoulder: Secondary | ICD-10-CM | POA: Diagnosis not present

## 2017-10-19 DIAGNOSIS — M25512 Pain in left shoulder: Secondary | ICD-10-CM | POA: Diagnosis not present

## 2017-10-22 DIAGNOSIS — M25512 Pain in left shoulder: Secondary | ICD-10-CM | POA: Diagnosis not present

## 2017-10-28 DIAGNOSIS — M25512 Pain in left shoulder: Secondary | ICD-10-CM | POA: Diagnosis not present

## 2017-10-29 DIAGNOSIS — Z96612 Presence of left artificial shoulder joint: Secondary | ICD-10-CM | POA: Diagnosis not present

## 2017-10-29 DIAGNOSIS — M7022 Olecranon bursitis, left elbow: Secondary | ICD-10-CM | POA: Diagnosis not present

## 2017-11-02 DIAGNOSIS — M25512 Pain in left shoulder: Secondary | ICD-10-CM | POA: Diagnosis not present

## 2017-11-04 DIAGNOSIS — M25512 Pain in left shoulder: Secondary | ICD-10-CM | POA: Diagnosis not present

## 2017-11-07 IMAGING — DX DG KNEE 1-2V PORT*L*
2 series · 2 of 2 positions shown · non-contrast
Comparison: MRI 12/30/2016

CLINICAL DATA: Post left knee replacement.

EXAM:
PORTABLE LEFT KNEE - 1-2 VIEW

[knee ap]
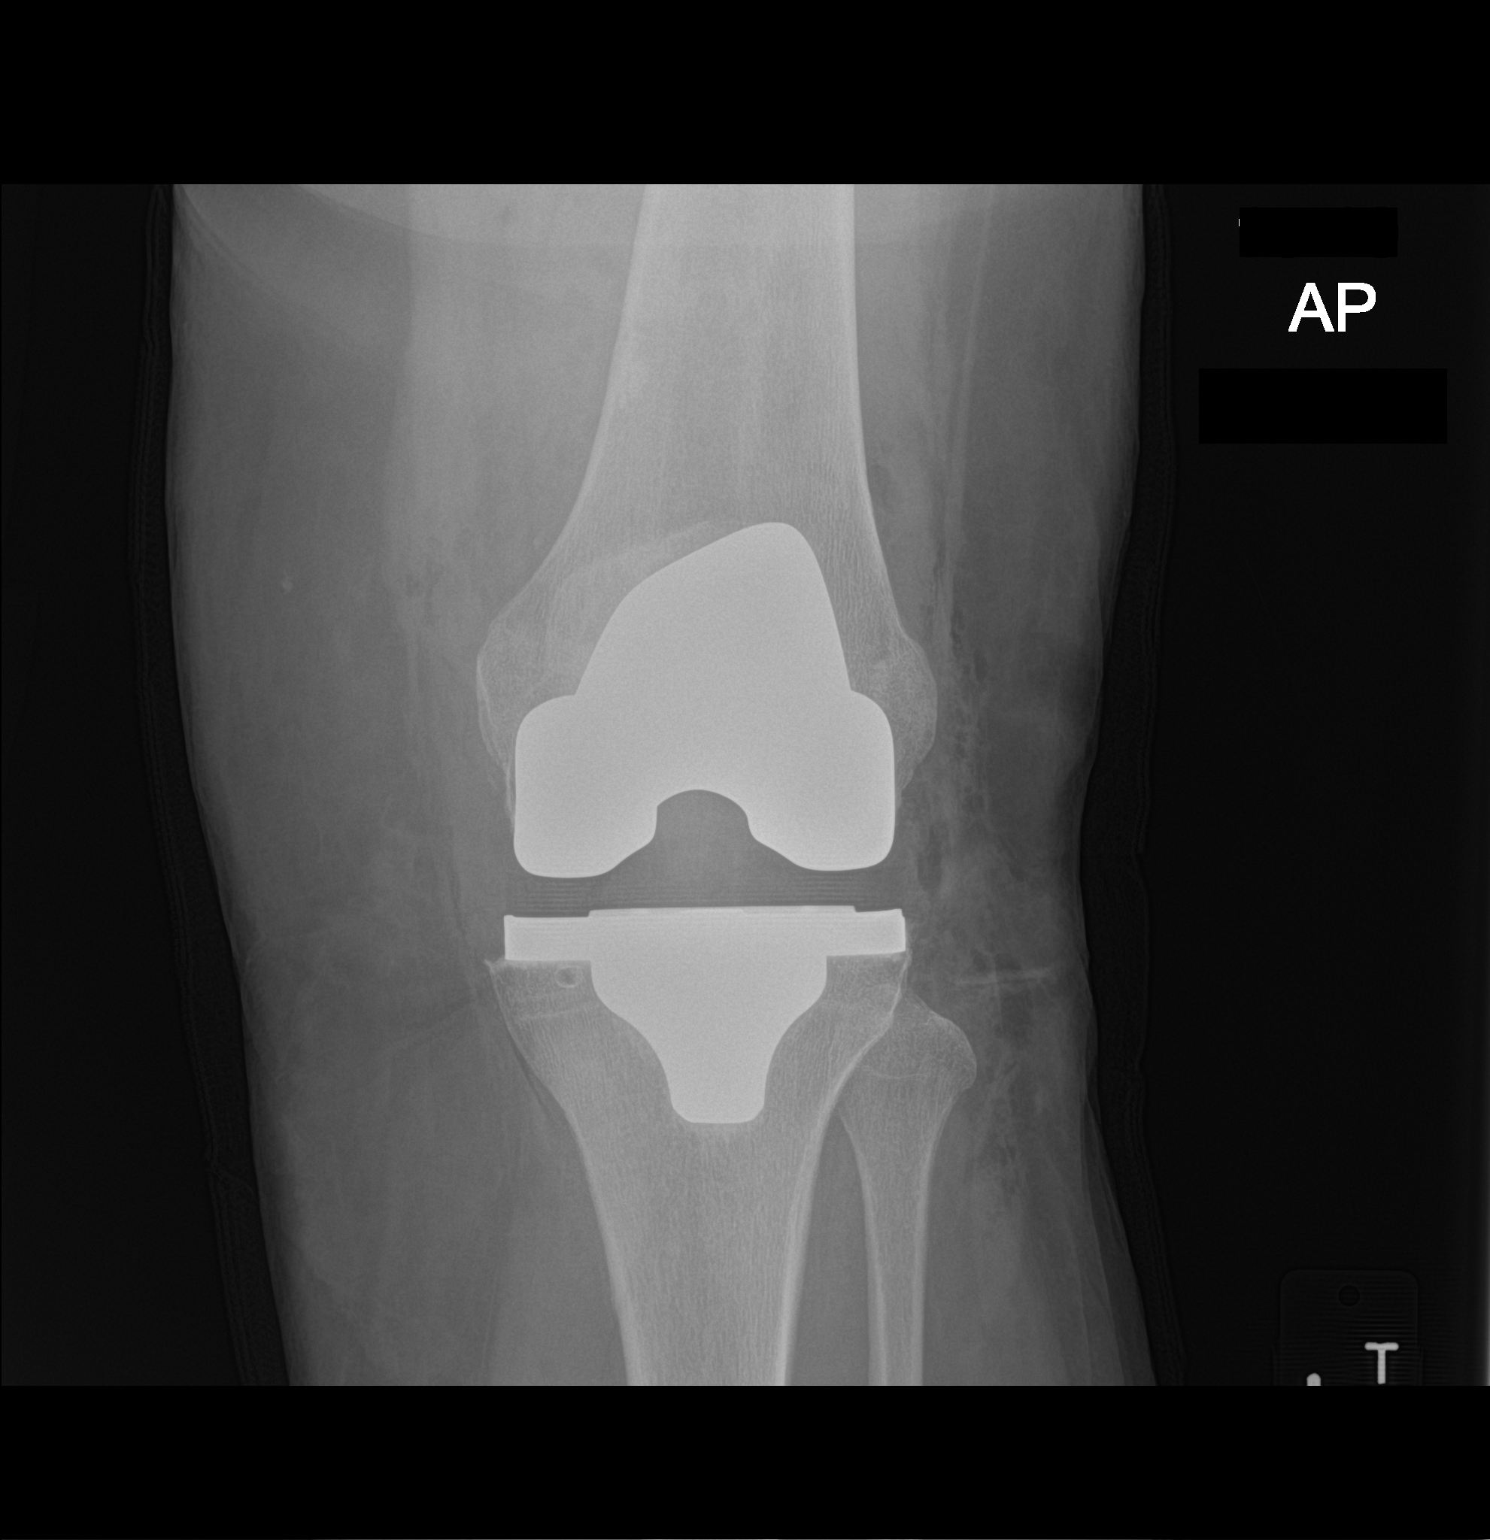

[knee lat]
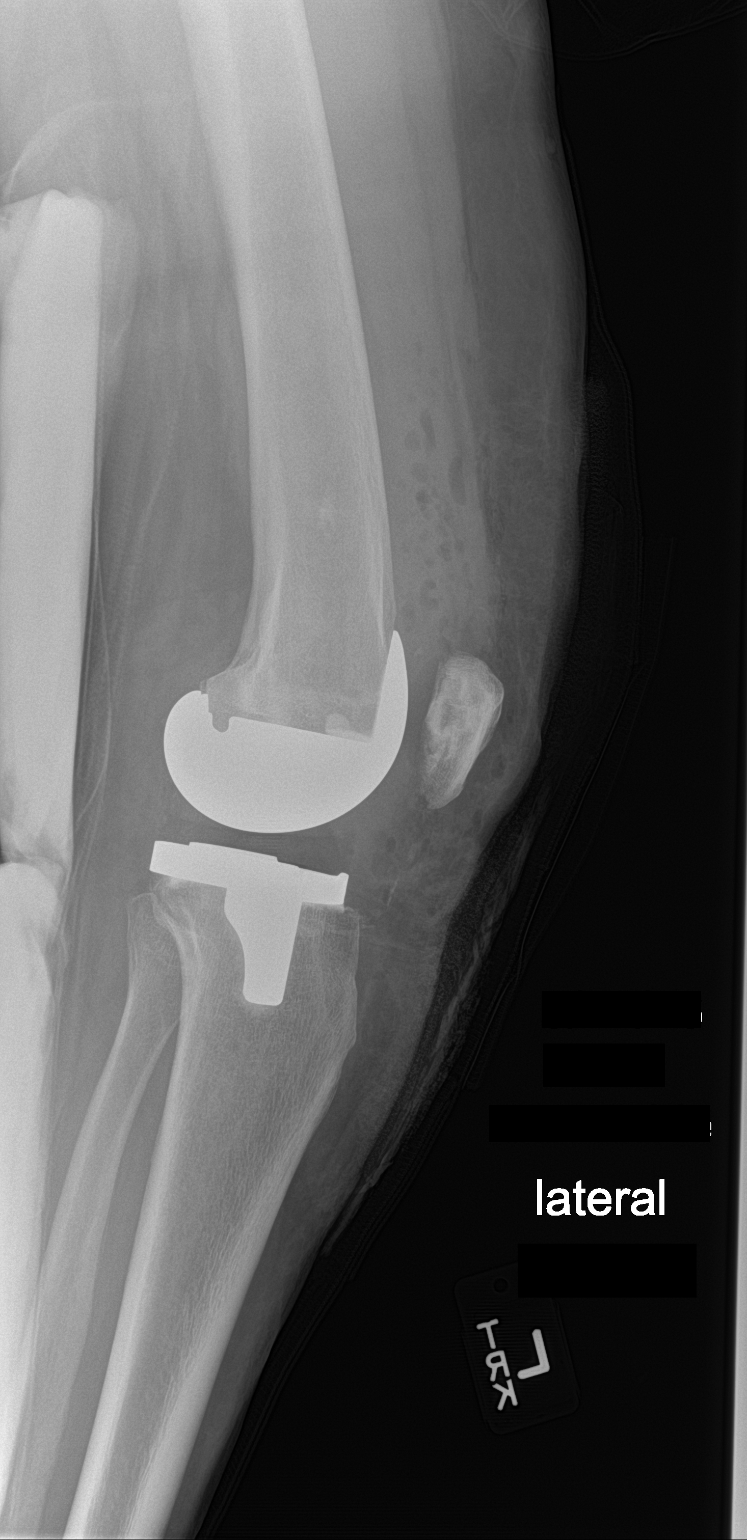

[2 of 2 positions shown; findings below may reference images not displayed]

FINDINGS: Changes of left knee replacement. No hardware or bony complicating
feature. Soft tissue and joint space gas noted.
IMPRESSION: Left knee replacement.  No complicating feature.

## 2017-11-11 DIAGNOSIS — E039 Hypothyroidism, unspecified: Secondary | ICD-10-CM | POA: Diagnosis not present

## 2017-11-11 DIAGNOSIS — K219 Gastro-esophageal reflux disease without esophagitis: Secondary | ICD-10-CM | POA: Diagnosis not present

## 2017-11-11 DIAGNOSIS — I2510 Atherosclerotic heart disease of native coronary artery without angina pectoris: Secondary | ICD-10-CM | POA: Diagnosis not present

## 2017-11-11 DIAGNOSIS — I10 Essential (primary) hypertension: Secondary | ICD-10-CM | POA: Diagnosis not present

## 2017-11-11 DIAGNOSIS — M25512 Pain in left shoulder: Secondary | ICD-10-CM | POA: Diagnosis not present

## 2017-11-11 DIAGNOSIS — Z0000 Encounter for general adult medical examination without abnormal findings: Secondary | ICD-10-CM | POA: Diagnosis not present

## 2017-11-11 DIAGNOSIS — I6523 Occlusion and stenosis of bilateral carotid arteries: Secondary | ICD-10-CM | POA: Diagnosis not present

## 2017-11-11 DIAGNOSIS — R252 Cramp and spasm: Secondary | ICD-10-CM | POA: Diagnosis not present

## 2017-11-11 DIAGNOSIS — E785 Hyperlipidemia, unspecified: Secondary | ICD-10-CM | POA: Diagnosis not present

## 2017-11-11 DIAGNOSIS — E7800 Pure hypercholesterolemia, unspecified: Secondary | ICD-10-CM | POA: Diagnosis not present

## 2017-11-13 DIAGNOSIS — M25512 Pain in left shoulder: Secondary | ICD-10-CM | POA: Diagnosis not present

## 2017-11-16 DIAGNOSIS — M25512 Pain in left shoulder: Secondary | ICD-10-CM | POA: Diagnosis not present

## 2018-01-21 ENCOUNTER — Ambulatory Visit (INDEPENDENT_AMBULATORY_CARE_PROVIDER_SITE_OTHER): Payer: Medicare HMO | Admitting: Orthopaedic Surgery

## 2018-01-21 ENCOUNTER — Encounter (INDEPENDENT_AMBULATORY_CARE_PROVIDER_SITE_OTHER): Payer: Self-pay | Admitting: Orthopaedic Surgery

## 2018-01-21 DIAGNOSIS — Z96652 Presence of left artificial knee joint: Secondary | ICD-10-CM | POA: Diagnosis not present

## 2018-01-21 NOTE — Progress Notes (Signed)
The patient is status post a left total knee arthroplasty almost a year ago.  She says she is doing great and has no issues with her knee at all.  She says her range of motion and strength are full.  On exam she is not walking and this is advised.  There is no knee effusion.  Her knee is not warm.  She has full extension to full flexion.  The knee does feel just slightly ligamentously lax but she says that she is not unstable to her going up and down stairs or stepping up the curves and does not feel like there are any issues with it to her.  At this point she will follow-up as needed.  All questions and concerns were answered and addressed.  I told her that if the knee starts bothering her air anyway and she developed swelling or symptoms of instability she should let us know and come in for an x-ray.  She will otherwise follow-up as needed.

## 2018-07-05 ENCOUNTER — Ambulatory Visit (INDEPENDENT_AMBULATORY_CARE_PROVIDER_SITE_OTHER): Payer: Medicare HMO | Admitting: Orthopaedic Surgery

## 2018-07-05 ENCOUNTER — Encounter (INDEPENDENT_AMBULATORY_CARE_PROVIDER_SITE_OTHER): Payer: Self-pay | Admitting: Orthopaedic Surgery

## 2018-07-05 ENCOUNTER — Ambulatory Visit (INDEPENDENT_AMBULATORY_CARE_PROVIDER_SITE_OTHER): Payer: Medicare HMO

## 2018-07-05 DIAGNOSIS — Z96652 Presence of left artificial knee joint: Secondary | ICD-10-CM

## 2018-07-05 DIAGNOSIS — M25562 Pain in left knee: Secondary | ICD-10-CM | POA: Diagnosis not present

## 2018-07-05 MED ORDER — METHYLPREDNISOLONE ACETATE 40 MG/ML IJ SUSP
40.0000 mg | INTRAMUSCULAR | Status: AC | PRN
  Administered 2018-07-05: 40 mg via INTRA_ARTICULAR

## 2018-07-05 MED ORDER — LIDOCAINE HCL 1 % IJ SOLN
3.0000 mL | INTRAMUSCULAR | Status: AC | PRN
  Administered 2018-07-05: 3 mL

## 2018-07-05 NOTE — Progress Notes (Signed)
Office Visit Note   Patient: Sandra Abbott           Date of Birth: 1941-05-20           MRN: [id number] Visit Date: 07/05/2018              Requested by: Derrill Center., MD [address] [place],  [address] PCP: Derrill Center., MD   Assessment & Plan: Visit Diagnoses:  1. Left knee pain, unspecified chronicity   2. Status post total left knee replacement     Plan: Most of her pain seems to be around the patella and quad tendons themselves.  I did offer an intra-articular injection with a steroid which I think may help.  She is not a diabetic she has no evidence of infection so felt comfortable doing this one-time injection.  She has been to work on quad strengthening exercises and I showed her how to do these and had her demonstrate back these exercises.  All questions concerns were answered and addressed.  Follow-up will be as needed.  Follow-Up Instructions: Return in about 4 weeks (around 08/02/2018), or if symptoms worsen or fail to improve.   Orders:  Orders Placed This Encounter  Procedures  . Large Joint Inj  . XR Knee 1-2 Views Left   No orders of the defined types were placed in this encounter.     Procedures: Large Joint Inj: L knee on 07/05/2018 11:55 AM Indications: diagnostic evaluation and pain Details: 22 G 1.5 in needle, superolateral approach  Arthrogram: No  Medications: 3 mL lidocaine 1 %; 40 mg methylPREDNISolone acetate 40 MG/ML Outcome: tolerated well, no immediate complications Procedure, treatment alternatives, risks and benefits explained, specific risks discussed. Consent was given by the patient. Immediately prior to procedure a time out was called to verify the correct patient, procedure, equipment, support staff and site/side marked as required. Patient was prepped and draped in the usual sterile fashion.       Clinical Data: No additional findings.   Subjective: Chief Complaint  Patient presents with  . Left Knee - Pain    The patient is a very pleasant 77 year old active female who is 17 months status post a left total knee arthroplasty.  She is in the knee has been aching recently but she is been doing a lot of activities.  Really hurts when she is been sitting well in the toilet and getting up to walk.  She feels like it is a little puffy itself.  She denies any instability symptoms.  She denies any recent illnesses.  She denies any fever and chills.  HPI  Review of Systems She currently denies any headache, chest pain, shortness of breath, fever, chills, nausea, vomiting.  Objective: Vital Signs: There were no vitals taken for this visit.  Physical Exam She is alert and oriented x3 in no acute distress.  She does not walk with a limp. Ortho Exam Examination of her left operative knee shows full range of motion.  The knee is not warm.  There is no effusion.  The incisions well-healed.  The knee feels ligamentously stable. Specialty Comments:  No specialty comments available.  Imaging: Xr Knee 1-2 Views Left  Result Date: 07/05/2018 2 views of the left knee show total knee arthroplasty with no complicating features.  There is no evidence of loosening.    PMFS History: Patient Active Problem List   Diagnosis Date Noted  . Status post total left knee replacement 02/10/2017  .  Unilateral primary osteoarthritis, left knee 01/05/2017   Past Medical History:  Diagnosis Date  . Arthritis    "all over I reckon" (02/11/2017)  . Coronary artery disease   . GERD (gastroesophageal reflux disease)   . High cholesterol   . History of hiatal hernia   . Hypertension   . Hypothyroidism   . PONV (postoperative nausea and vomiting)   . 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 No pertinent family history.  Past Surgical History:  Procedure Laterality Date  . APPENDECTOMY  1964  . BACK SURGERY    . BLADDER SUSPENSION  2011  . CATARACT EXTRACTION, BILATERAL Bilateral 03/2016-04/2016  .  CHOLECYSTECTOMY OPEN  1964  . CORONARY ANGIOPLASTY WITH STENT PLACEMENT  06/2010  . DILATION AND CURETTAGE OF UTERUS    . JOINT REPLACEMENT    . [place] SURGERY  2011  . NASAL SEPTUM SURGERY  after 1991  . RECTOCELE REPAIR  2011   "I had 3 of these hernias repaired"  . TONSILLECTOMY  1946  . TOTAL KNEE ARTHROPLASTY Left 02/10/2017  . TOTAL KNEE ARTHROPLASTY Left 02/10/2017   Procedure: LEFT TOTAL KNEE ARTHROPLASTY;  Surgeon: Mcarthur Rossetti, MD;  Location: [place];  Service: Orthopedics;  Laterality: Left;  [place] VAGINAL HYSTERECTOMY  1986   Social History   Occupational History  . Not on file  Tobacco Use  . Smoking status: Former Smoker    Packs/day: 0.50    Types: Cigarettes    Last attempt to quit: 05/06/1988    Years since quitting: 30.1  . Smokeless tobacco: Never Used  Substance and Sexual Activity  . Alcohol use: No  . Drug use: No  . Sexual activity: Not on file

## 2018-08-16 ENCOUNTER — Ambulatory Visit (INDEPENDENT_AMBULATORY_CARE_PROVIDER_SITE_OTHER): Payer: Medicare HMO | Admitting: Orthopaedic Surgery

## 2018-08-16 ENCOUNTER — Encounter (INDEPENDENT_AMBULATORY_CARE_PROVIDER_SITE_OTHER): Payer: Self-pay | Admitting: Orthopaedic Surgery

## 2018-08-16 DIAGNOSIS — M25562 Pain in left knee: Secondary | ICD-10-CM

## 2018-08-16 MED ORDER — METHYLPREDNISOLONE ACETATE 40 MG/ML IJ SUSP
40.0000 mg | INTRAMUSCULAR | Status: AC | PRN
  Administered 2018-08-16: 40 mg via INTRAMUSCULAR

## 2018-08-16 MED ORDER — LIDOCAINE HCL 1 % IJ SOLN
1.0000 mL | INTRAMUSCULAR | Status: AC | PRN
  Administered 2018-08-16: 1 mL

## 2018-08-16 NOTE — Progress Notes (Signed)
   Procedure Note  Patient: Sandra Abbott             Date of Birth: 1941-05-03           MRN: [id number]             Visit Date: 08/16/2018 HPI: Mrs. Sandra Abbott returns today follow-up of her left knee pain.  She is now status post left total knee arthroplasty 18 months.  She had no fevers chills.  No injury.  Continues to have pain in the left knee despite a left knee injection by Dr. Ninfa Linden on 07/05/2018.  Left knee: Good range of motion without pain.  Tenderness over the pes anserinus.  No instability valgus varus stressing.  No abnormal warmth erythema or ecchymosis.  Surgical incisions well-healed.  Calf supple nontender  Procedures: Visit Diagnoses: No diagnosis found.  Trigger Point Inj Date/Time: 08/16/2018 5:43 PM Performed by: Pete Pelt, PA-C Authorized by: Pete Pelt, PA-C   Consent Given by:  Patient Indications:  Pain and therapeutic Total # of Trigger Points:  1 Location: lower extremity   Location comment:  Left pes ansernius  Medications #1:  1 mL lidocaine 1 %; 40 mg methylPREDNISolone acetate 40 MG/ML Patient tolerance:  Patient tolerated the procedure well with no immediate complications  Plan: She is given quad strengthening exercises to perform on her own at home.  She does not improve with these in regards to her left knee pain in the next week along with the injection then would recommend formal physical therapy.  Questions were encouraged and answered.  She will follow-up PRN pain persist or becomes worse.
